# Patient Record
Sex: Male | Born: 1965
Health system: Southern US, Community
[De-identification: ages and names within clinical notes are randomized; demographics above are authoritative.]

## PROBLEM LIST (undated history)

## (undated) DIAGNOSIS — I4891 Unspecified atrial fibrillation: Secondary | ICD-10-CM

## (undated) DIAGNOSIS — T7840XA Allergy, unspecified, initial encounter: Secondary | ICD-10-CM

## (undated) DIAGNOSIS — I499 Cardiac arrhythmia, unspecified: Secondary | ICD-10-CM

## (undated) DIAGNOSIS — G4730 Sleep apnea, unspecified: Secondary | ICD-10-CM

## (undated) DIAGNOSIS — I10 Essential (primary) hypertension: Secondary | ICD-10-CM

## (undated) DIAGNOSIS — M1990 Unspecified osteoarthritis, unspecified site: Secondary | ICD-10-CM

## (undated) DIAGNOSIS — G709 Myoneural disorder, unspecified: Secondary | ICD-10-CM

## (undated) HISTORY — DX: Unspecified osteoarthritis, unspecified site: M19.90

## (undated) HISTORY — DX: Unspecified atrial fibrillation: I48.91

## (undated) HISTORY — DX: Cardiac arrhythmia, unspecified: I49.9

## (undated) HISTORY — DX: Essential (primary) hypertension: I10

## (undated) HISTORY — DX: Myoneural disorder, unspecified: G70.9

## (undated) HISTORY — DX: Sleep apnea, unspecified: G47.30

## (undated) HISTORY — PX: CARDIOVERSION: SHX1299

## (undated) HISTORY — PX: INGUINAL HERNIA REPAIR: SUR1180

## (undated) HISTORY — DX: Allergy, unspecified, initial encounter: T78.40XA

---

## 1995-05-10 HISTORY — PX: APPENDECTOMY: SHX54

## 1998-08-20 ENCOUNTER — Observation Stay (HOSPITAL_COMMUNITY): Admission: EM | Admit: 1998-08-20 | Discharge: 1998-08-21 | Payer: Self-pay | Admitting: Emergency Medicine

## 1998-08-20 ENCOUNTER — Encounter: Payer: Self-pay | Admitting: Cardiology

## 2000-07-19 ENCOUNTER — Emergency Department (HOSPITAL_COMMUNITY): Admission: EM | Admit: 2000-07-19 | Discharge: 2000-07-19 | Payer: Self-pay | Admitting: Emergency Medicine

## 2000-07-19 ENCOUNTER — Encounter: Payer: Self-pay | Admitting: Emergency Medicine

## 2000-11-24 ENCOUNTER — Ambulatory Visit (HOSPITAL_COMMUNITY): Admission: RE | Admit: 2000-11-24 | Discharge: 2000-11-24 | Payer: Self-pay | Admitting: Cardiology

## 2008-08-12 ENCOUNTER — Other Ambulatory Visit: Admission: RE | Admit: 2008-08-12 | Discharge: 2008-08-12 | Payer: Self-pay | Admitting: Otolaryngology

## 2010-09-24 NOTE — Procedures (Signed)
[facility]  Patient:    Dominic Davies, Dominic Davies                         MRN: [id number] Proc. Date: 11/24/00 Adm. Date:  [id number] Disc. Date: [id number] Attending:  Rudean Hitt                           Procedure Report  PROCEDURE:  Elective cardioversion.  HISTORY:  This is a 45 year old gentleman with recent onset of atrial flutter.  He has been on Coumadin.  He has failed to convert on an outpatient regimen of beta blocker.  DESCRIPTION OF PROCEDURE:  After suitable anesthesia given intravenously, the patient was given a single 50 joule shock using biphasic defibrillator.  He promptly converted to sinus rhythm.  There were no arrhythmias.  There were no postanesthetic complications.  The patient will continue on Toprol XL 50 mg one daily, and he will continue on his Coumadin.  He will be rechecked in two weeks for office visit protime and EKG. DD:  11/24/00 TD:  11/25/00 Job: [id number]

## 2011-09-07 HISTORY — PX: INGUINAL HERNIA REPAIR: SUR1180

## 2014-05-04 NOTE — Progress Notes (Signed)
Patient ID: Dominic Davies, male   DOB: 07/05/1965, 48 y.o.   MRN: [id number]    48 yo referred for palpitations Saw Dr Mare Ferrari over 10 years ago for same. Had PAF  One episode broke in ER with cardizem  One year was on coumadin for a month and had [facility] Has been fine for over 10 years.  Some stress and axniety.  November started having intermittent palpitations. No real long episodes.  Rapid and irregular  Sometimes with exertion on elliptical. No presyncope  Occasional dypsnea  Occassional chest pain but radiates to shoulder which he has injured with capsulitis and restricted ROM.  No rest pain Last month palpitations less frequent only about 2 times lasting minutes.     ROS: Denies fever, malais, weight loss, blurry vision, decreased visual acuity, cough, sputum, SOB, hemoptysis, pleuritic pain, palpitaitons, heartburn, abdominal pain, melena, lower extremity edema, claudication, or rash.  All other systems reviewed and negative   General: Affect appropriate Healthy:  appears stated age 48: normal Neck supple with no adenopathy JVP normal no bruits no thyromegaly Lungs clear with no wheezing and good diaphragmatic motion Heart:  S1/S2 no murmur,rub, gallop or click PMI normal Abdomen: benighn, BS positve, no tenderness, no AAA no bruit.  No HSM or HJR Distal pulses intact with no bruits No edema Neuro non-focal Skin warm and dry No muscular weakness  Medications No current outpatient prescriptions on file.   No current facility-administered medications for this visit.    Allergies Review of patient's allergies indicates not on file.  Family History: No family history on file.  Social History: History   Social History  . Marital Status: Single    Spouse Name: N/A    Number of Children: N/A  . Years of Education: N/A   Occupational History  . Not on file.   Social History Main Topics  . Smoking status: Not on file  . Smokeless tobacco: Not on file  . Alcohol  Use: Not on file  . Drug Use: Not on file  . Sexual Activity: Not on file   Other Topics Concern  . Not on file   Social History Narrative  . No narrative on file    No past surgical history on file.  No past medical history on file.  Electrocardiogram:  SR rate 72  Normal 05/06/14    Assessment and Plan

## 2014-05-06 ENCOUNTER — Encounter: Payer: Self-pay | Admitting: Cardiovascular Disease

## 2014-05-06 ENCOUNTER — Ambulatory Visit (INDEPENDENT_AMBULATORY_CARE_PROVIDER_SITE_OTHER): Payer: 59 | Admitting: Cardiovascular Disease

## 2014-05-06 VITALS — BP 133/78 | HR 72 | Ht 75.0 in | Wt 187.0 lb

## 2014-05-06 DIAGNOSIS — R079 Chest pain, unspecified: Secondary | ICD-10-CM | POA: Insufficient documentation

## 2014-05-06 DIAGNOSIS — R002 Palpitations: Secondary | ICD-10-CM | POA: Insufficient documentation

## 2014-05-06 LAB — CBC WITH DIFFERENTIAL/PLATELET
Basophils Absolute: 0 10*3/uL (ref 0.0–0.1)
Basophils Relative: 0.6 % (ref 0.0–3.0)
Eosinophils Absolute: 0.4 10*3/uL (ref 0.0–0.7)
Eosinophils Relative: 5 % (ref 0.0–5.0)
HCT: 45.6 % (ref 39.0–52.0)
Hemoglobin: 15.4 g/dL (ref 13.0–17.0)
Lymphocytes Relative: 32.2 % (ref 12.0–46.0)
Lymphs Abs: 2.5 10*3/uL (ref 0.7–4.0)
MCHC: 33.7 g/dL (ref 30.0–36.0)
MCV: 89.4 fl (ref 78.0–100.0)
Monocytes Absolute: 0.6 10*3/uL (ref 0.1–1.0)
Monocytes Relative: 8.3 % (ref 3.0–12.0)
Neutro Abs: 4.2 10*3/uL (ref 1.4–7.7)
Neutrophils Relative %: 53.9 % (ref 43.0–77.0)
Platelets: 281 10*3/uL (ref 150.0–400.0)
RBC: 5.1 Mil/uL (ref 4.22–5.81)
RDW: 13 % (ref 11.5–15.5)
WBC: 7.7 10*3/uL (ref 4.0–10.5)

## 2014-05-06 LAB — BASIC METABOLIC PANEL
BUN: 13 mg/dL (ref 6–23)
CO2: 28 mEq/L (ref 19–32)
Calcium: 9.2 mg/dL (ref 8.4–10.5)
Chloride: 104 mEq/L (ref 96–112)
Creatinine, Ser: 0.9 mg/dL (ref 0.4–1.5)
GFR: 94.09 mL/min (ref >60.00)
Glucose, Bld: 97 mg/dL (ref 70–99)
Potassium: 4.1 mEq/L (ref 3.5–5.1)
Sodium: 140 mEq/L (ref 135–145)

## 2014-05-06 LAB — T4, FREE: Free T4: 0.85 ng/dL (ref 0.60–1.60)

## 2014-05-06 LAB — TSH: TSH: 1.1 u[IU]/mL (ref 0.35–4.50)

## 2014-05-06 MED ORDER — PROPRANOLOL HCL 10 MG PO TABS: 10.0000 mg | ORAL_TABLET | Freq: Every day | ORAL | Status: DC | PRN

## 2014-05-06 NOTE — Patient Instructions (Signed)
Your physician recommends that you schedule a follow-up appointment in:  [place] has recommended you make the following change in your medication:  MAY  TAKE  PROPRANOLOL  10 MG  DAILY AS NEEDED Your physician has requested that you have a stress echocardiogram. For further information please visit [email or web address]. Please follow instruction sheet as given. Your physician recommends that you return for lab work in:  TODAY   BMET  CBC TSH  FREE T4

## 2014-05-06 NOTE — Assessment & Plan Note (Signed)
CHADVASD 0  Not frequent enough for monitoring  PRN Inderal assess atrial size with stress  Echo. If no evidence of CAD can consider pill in pocket flecainide for prolonged episodes.  ASA

## 2014-05-06 NOTE — Assessment & Plan Note (Signed)
Atypical baseline ECG normal with history of PAF will order stress echo

## 2014-05-13 ENCOUNTER — Encounter: Payer: Self-pay | Admitting: *Deleted

## 2014-05-14 ENCOUNTER — Encounter: Payer: Self-pay | Admitting: Cardiology

## 2014-05-14 ENCOUNTER — Ambulatory Visit (HOSPITAL_COMMUNITY): Payer: 59 | Attending: Cardiovascular Disease

## 2014-05-14 DIAGNOSIS — R079 Chest pain, unspecified: Secondary | ICD-10-CM | POA: Diagnosis present

## 2014-05-14 NOTE — Progress Notes (Signed)
Echo performed. 

## 2014-06-05 ENCOUNTER — Encounter: Payer: Self-pay | Admitting: *Deleted

## 2014-07-27 NOTE — Progress Notes (Signed)
Patient ID: Dominic Davies, male   DOB: Feb 09, 1966, 49 y.o.   MRN: [id number]    49 y.o.  referred for palpitations Saw Dr Mare Ferrari over 10 years ago for same. Had PAF  One episode broke in ER with cardizem  One year was on coumadin for a month and had [facility] Has been fine for over 10 years.  Some stress and axniety.  November started having intermittent palpitations. No real long episodes.  Rapid and irregular  Sometimes with exertion on elliptical. No presyncope  Occasional dypsnea  Occassional chest pain but radiates to shoulder which he has injured with capsulitis and restricted ROM.  No rest pain Last month palpitations less frequent only about 2 times lasting minutes.    F/U stress echo reviewed 05/14/14  Normal with EF 60% and no valve disease   ROS: Denies fever, malais, weight loss, blurry vision, decreased visual acuity, cough, sputum, SOB, hemoptysis, pleuritic pain, palpitaitons, heartburn, abdominal pain, melena, lower extremity edema, claudication, or rash.  All other systems reviewed and negative   General: Affect appropriate Healthy:  appears stated age 49: normal Neck supple with no adenopathy JVP normal no bruits no thyromegaly Lungs clear with no wheezing and good diaphragmatic motion Heart:  S1/S2 no murmur,rub, gallop or click PMI normal Abdomen: benighn, BS positve, no tenderness, no AAA no bruit.  No HSM or HJR Distal pulses intact with no bruits No edema Neuro non-focal Skin warm and dry No muscular weakness  Medications Current Outpatient Prescriptions  Medication Sig Dispense Refill  . propranolol (INDERAL) 10 MG tablet Take 1 tablet (10 mg total) by mouth daily as needed. 30 tablet 3   No current facility-administered medications for this visit.    Allergies Review of patient's allergies indicates no known allergies.  Family History: Family History  Problem Relation Age of Onset  . Heart attack    . Heart disease    . Stroke    . Hypertension      . CAD    . Parkinson's disease Father     Social History: History   Social History  . Marital Status: Single    Spouse Name: N/A  . Number of Children: N/A  . Years of Education: N/A   Occupational History  . Not on file.   Social History Main Topics  . Smoking status: Never Smoker   . Smokeless tobacco: Never Used  . Alcohol Use: No  . Drug Use: No  . Sexual Activity: Not on file   Other Topics Concern  . Not on file   Social History Narrative  . No narrative on file    Past Surgical History  Procedure Laterality Date  . Appendectomy  1997  . Inguinal hernia repair Left 09/2011  . Cardioversion      Past Medical History  Diagnosis Date  . Irregular heart beat     Electrocardiogram:  SR rate 72  Normal 05/06/14    Assessment and Plan

## 2014-07-29 ENCOUNTER — Encounter: Payer: 59 | Admitting: Cardiovascular Disease

## 2014-12-31 ENCOUNTER — Other Ambulatory Visit: Payer: Self-pay

## 2014-12-31 DIAGNOSIS — R002 Palpitations: Secondary | ICD-10-CM

## 2014-12-31 MED ORDER — PROPRANOLOL HCL 10 MG PO TABS: 10.0000 mg | ORAL_TABLET | Freq: Every day | ORAL | Status: DC | PRN

## 2018-04-13 DIAGNOSIS — Z8679 Personal history of other diseases of the circulatory system: Secondary | ICD-10-CM | POA: Insufficient documentation

## 2018-04-13 DIAGNOSIS — I10 Essential (primary) hypertension: Secondary | ICD-10-CM | POA: Diagnosis not present

## 2018-04-26 DIAGNOSIS — J22 Unspecified acute lower respiratory infection: Secondary | ICD-10-CM | POA: Diagnosis not present

## 2018-04-26 DIAGNOSIS — R05 Cough: Secondary | ICD-10-CM | POA: Diagnosis not present

## 2018-05-11 DIAGNOSIS — Z8679 Personal history of other diseases of the circulatory system: Secondary | ICD-10-CM | POA: Diagnosis not present

## 2018-05-11 DIAGNOSIS — I10 Essential (primary) hypertension: Secondary | ICD-10-CM | POA: Insufficient documentation

## 2018-05-11 DIAGNOSIS — Z23 Encounter for immunization: Secondary | ICD-10-CM | POA: Diagnosis not present

## 2018-05-11 DIAGNOSIS — Z125 Encounter for screening for malignant neoplasm of prostate: Secondary | ICD-10-CM | POA: Diagnosis not present

## 2018-06-27 NOTE — Progress Notes (Signed)
Cardiology Office Note   Date:  07/05/2018   ID:  ZYMARION FAVORITE, DOB 11/15/1965, MRN [id number]  PCP:  Patient, No Pcp Per  Cardiologist:   Jenkins Rouge, MD   No chief complaint on file.     History of Present Illness: Dominic Davies is a 53 y.o. male who presents for consultation regarding palpitations and elevated BP. Referred by [place] FNP No primary. 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 Irregular heart beat     Past Surgical History:  Procedure Laterality Date  . APPENDECTOMY  1997  . CARDIOVERSION    . INGUINAL HERNIA REPAIR Left 09/2011     Current Outpatient Medications  Medication Sig Dispense Refill  . lisinopril (PRINIVIL,ZESTRIL) 20 MG tablet Take 1 tablet by mouth daily.     No current facility-administered medications for this visit.     Allergies:   Patient has no known allergies.    Social History:  The patient  reports that he has never smoked. He has never used smokeless tobacco. He reports that he does not drink alcohol or use drugs.   Family History:  The patient's family history includes CAD in his unknown relative; Heart attack in his unknown relative; Heart disease in his unknown relative; Hypertension in his unknown relative; Parkinson's disease in his father; Stroke in his unknown relative.    ROS:  Please see the history of present illness.   Otherwise, review of systems are positive for none.   All other systems are reviewed and negative.    PHYSICAL EXAM: VS:  BP 130/86   Pulse (!) 59   Ht 6\' 3"  (1.905 m)   Wt  86.2 kg   SpO2 97%   BMI 23.75 kg/m  , BMI Body mass index is 23.75 kg/m. Affect appropriate Healthy:  appears stated age 30: normal Neck supple with no adenopathy JVP normal no bruits no thyromegaly Lungs clear with no wheezing and good diaphragmatic motion Heart:  S1/S2 no murmur, no rub, gallop or click PMI normal Abdomen: benighn, BS positve, no tenderness, no AAA no bruit.  No HSM or HJR Distal pulses intact with no bruits No edema Neuro non-focal Skin warm and dry No muscular weakness    EKG:  07/05/18 NSR normal rate 59 LAE    Recent Labs: No results found for requested labs within last 8760 hours.    Lipid Panel No results found for: CHOL, TRIG, HDL, CHOLHDL, VLDL, LDLCALC, LDLDIRECT    Wt Readings from Last 3 Encounters:  07/05/18 86.2 kg  05/06/14 84.8 kg      Other studies Reviewed: Additional studies/ records that were reviewed today include: notes from cardiology 2015 stress echo ECG .    ASSESSMENT AND PLAN:  1.  Palpitations:/ PAF history 30 day event monitor  2.  BP:  Improved with ACE continue     Current medicines are reviewed at length with the patient today.  The patient does not have concerns regarding medicines.  The following changes have been made:  None  Labs/ tests ordered today include: Event monitor   Orders Placed This Encounter  Procedures  . CARDIAC EVENT MONITOR  . EKG 12-Lead     Disposition:   FU with cardiology in 4-6 weeks      Signed, Jenkins Rouge, MD  07/05/2018 9:23 AM    [address], [place], [place]  [address] Phone: [phone]; Fax: [phone]

## 2018-07-05 ENCOUNTER — Encounter: Payer: Self-pay | Admitting: Cardiovascular Disease

## 2018-07-05 ENCOUNTER — Encounter (INDEPENDENT_AMBULATORY_CARE_PROVIDER_SITE_OTHER): Payer: Self-pay

## 2018-07-05 ENCOUNTER — Ambulatory Visit: Payer: BLUE CROSS/BLUE SHIELD | Admitting: Cardiovascular Disease

## 2018-07-05 VITALS — BP 130/86 | HR 59 | Ht 75.0 in | Wt 190.0 lb

## 2018-07-05 DIAGNOSIS — I10 Essential (primary) hypertension: Secondary | ICD-10-CM

## 2018-07-05 DIAGNOSIS — R002 Palpitations: Secondary | ICD-10-CM

## 2018-07-05 DIAGNOSIS — Z8679 Personal history of other diseases of the circulatory system: Secondary | ICD-10-CM | POA: Diagnosis not present

## 2018-07-05 NOTE — Patient Instructions (Signed)
Medication Instructions:   If you need a refill on your cardiac medications before your next appointment, please call your pharmacy.   Lab work:  If you have labs (blood work) drawn today and your tests are completely normal, you will receive your results only by: Marland Kitchen MyChart Message (if you have MyChart) OR . A paper copy in the mail If you have any lab test that is abnormal or we need to change your treatment, we will call you to review the results.  Testing/Procedures:  Your physician has recommended that you wear an event monitor. Event monitors are medical devices that record the heart's electrical activity. Doctors most often [place] these monitors to diagnose arrhythmias. Arrhythmias are problems with the speed or rhythm of the heartbeat. The monitor is a small, portable device. You can wear one while you do your normal daily activities. This is usually used to diagnose what is causing palpitations/syncope (passing out).  Follow-Up: At [facility], you and your health needs are our priority.  As part of our continuing mission to provide you with exceptional heart care, we have created designated Provider Care Teams.  These Care Teams include your primary Cardiologist (physician) and Advanced Practice Providers (APPs -  Physician Assistants and Nurse Practitioners) who all work together to provide you with the care you need, when you need it. You will need a follow up appointment in 6 weeks. You may see Dr. Johnsie Cancel or one of the following Advanced Practice Providers on your designated Care Team:   Truitt Merle, NP Cecilie Kicks, NP . Kathyrn Drown, NP

## 2018-07-11 ENCOUNTER — Ambulatory Visit (INDEPENDENT_AMBULATORY_CARE_PROVIDER_SITE_OTHER): Payer: BLUE CROSS/BLUE SHIELD

## 2018-07-11 DIAGNOSIS — R002 Palpitations: Secondary | ICD-10-CM | POA: Diagnosis not present

## 2018-07-11 DIAGNOSIS — Z8679 Personal history of other diseases of the circulatory system: Secondary | ICD-10-CM | POA: Diagnosis not present

## 2018-07-11 DIAGNOSIS — I10 Essential (primary) hypertension: Secondary | ICD-10-CM | POA: Diagnosis not present

## 2018-08-03 ENCOUNTER — Encounter: Payer: Self-pay | Admitting: Cardiovascular Disease

## 2018-08-07 ENCOUNTER — Telehealth: Payer: Self-pay

## 2018-08-07 NOTE — Telephone Encounter (Signed)
Patient is going to sign up for MyChart, so will send instructions to patient through MyChart once he is signed up.      Virtual Visit Pre-Appointment Phone Call  Steps For Call:  1. Confirm consent - "In the setting of the current Covid19 crisis, you are scheduled for a (phone or video) visit with your provider on (date) at (time).  Just as we do with many in-office visits, in order for you to participate in this visit, we must obtain consent.  If you'd like, I can send this to your mychart (if signed up) or email for you to review.  Otherwise, I can obtain your verbal consent now.  All virtual visits are billed to your insurance company just like a normal visit would be.  By agreeing to a virtual visit, we'd like you to understand that the technology does not allow for your provider to perform an examination, and thus may limit your provider's ability to fully assess your condition.  Finally, though the technology is pretty good, we cannot assure that it will always work on either your or our end, and in the setting of a video visit, we may have to convert it to a phone-only visit.  In either situation, we cannot ensure that we have a secure connection.  Are you willing to proceed?"  2. Give patient instructions for WebEx download to smartphone as below if video visit  3. Advise patient to be prepared with any vital sign or heart rhythm information, their current medicines, and a piece of paper and pen handy for any instructions they may receive the day of their visit  4. Inform patient they will receive a phone call 15 minutes prior to their appointment time (may be from unknown caller ID) so they should be prepared to answer  5. Confirm that appointment type is correct in Epic appointment notes (video vs telephone)    TELEPHONE CALL NOTE  Dominic Davies has been deemed a candidate for a follow-up tele-health visit to limit community exposure during the Covid-19 pandemic. I spoke with the  patient via phone to ensure availability of phone/video source, confirm preferred email & phone number, and discuss instructions and expectations.  I reminded Dominic Davies to be prepared with any vital sign and/or heart rhythm information that could potentially be obtained via home monitoring, at the time of his visit. I reminded Dominic Davies to expect a phone call at the time of his visit if his visit.  Did the patient verbally acknowledge consent to treatment? yes  Michaelyn Barter, RN 08/07/2018 11:10 AM   DOWNLOADING THE [place] TO SMARTPHONE  - If Apple, go to [employer] and type in WebEx in the search bar. [place] [employer], the blue/green circle. The app is free but as with any other app downloads, their phone may require them to verify saved payment information or Apple password. The patient does NOT have to create an account.  - If Android, ask patient to go to [employer] and type in WebEx in the search bar. [place] [employer], the blue/green circle. The app is free but as with any other app downloads, their phone may require them to verify saved payment information or Android password. The patient does NOT have to create an account.   CONSENT FOR TELE-HEALTH VISIT - PLEASE REVIEW  I hereby voluntarily request, consent and authorize [facility] and its employed or contracted physicians, Engineer, materials, nurse practitioners or  other licensed health care professionals (the Practitioner), to provide me with telemedicine health care services (the "Services") as deemed necessary by the treating Practitioner. I acknowledge and consent to receive the Services by the Practitioner via telemedicine. I understand that the telemedicine visit will involve communicating with the Practitioner through live audiovisual communication technology and the disclosure of certain medical information by electronic transmission. I acknowledge that I have been given  the opportunity to request an in-person assessment or other available alternative prior to the telemedicine visit and am voluntarily participating in the telemedicine visit.  I understand that I have the right to withhold or withdraw my consent to the use of telemedicine in the course of my care at any time, without affecting my right to future care or treatment, and that the Practitioner or I may terminate the telemedicine visit at any time. I understand that I have the right to inspect all information obtained and/or recorded in the course of the telemedicine visit and may receive copies of available information for a reasonable fee.  I understand that some of the potential risks of receiving the Services via telemedicine include:  Marland Kitchen Delay or interruption in medical evaluation due to technological equipment failure or disruption; . Information transmitted may not be sufficient (e.g. poor resolution of images) to allow for appropriate medical decision making by the Practitioner; and/or  . In rare instances, security protocols could fail, causing a breach of personal health information.  Furthermore, I acknowledge that it is my responsibility to provide information about my medical history, conditions and care that is complete and accurate to the best of my ability. I acknowledge that Practitioner's advice, recommendations, and/or decision may be based on factors not within their control, such as incomplete or inaccurate data provided by me or distortions of diagnostic images or specimens that may result from electronic transmissions. I understand that the practice of medicine is not an exact science and that Practitioner makes no warranties or guarantees regarding treatment outcomes. I acknowledge that I will receive a copy of this consent concurrently upon execution via email to the email address I last provided but may also request a printed copy by calling the office of [place].    I understand  that my insurance will be billed for this visit.   I have read or had this consent read to me. . I understand the contents of this consent, which adequately explains the benefits and risks of the Services being provided via telemedicine.  . I have been provided ample opportunity to ask questions regarding this consent and the Services and have had my questions answered to my satisfaction. . I give my informed consent for the services to be provided through the use of telemedicine in my medical care  By participating in this telemedicine visit I agree to the above.

## 2018-08-10 NOTE — Telephone Encounter (Signed)
Follow up      Webex trial run for 08-14-18 ov was successful.

## 2018-08-10 NOTE — Progress Notes (Signed)
Virtual Visit via Video Note    Evaluation Performed:  Follow-up visit  This visit type was conducted due to national recommendations for restrictions regarding the COVID-19 Pandemic (e.g. social distancing).  This format is felt to be most appropriate for this patient at this time.  All issues noted in this document were discussed and addressed.  No physical exam was performed (except for noted visual exam findings with Video Visits).  Please refer to the patient's chart (MyChart message for video visits and phone note for telephone visits) for the patient's consent to telehealth for [facility].  Date:  08/14/2018   ID:  Dominic Davies, DOB 06/21/1965, MRN [id number]  Patient Location:  Home  Provider location:   Office  PCP:  Patient, No Pcp Per  Cardiologist:  Johnsie Cancel Electrophysiologist:  None   Chief Complaint:  Palpitations   History of Present Illness:    Dominic Davies is a 53 y.o. male who presents via audio/video conferencing for a telehealth visit today.    First seen 07/05/18 for HTN and palpitations Had PAF in 2015 quiescent for years. 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    Prior CV studies:   The following studies were reviewed today:  Event monitor NSR PVC;s no significant arrhythmia  Past Medical History:  Diagnosis Date  . Irregular heart beat    Past Surgical History:  Procedure Laterality Date  . APPENDECTOMY  1997  . CARDIOVERSION    . INGUINAL HERNIA REPAIR Left 09/2011     Current Meds  Medication Sig  . lisinopril (PRINIVIL,ZESTRIL) 20 MG tablet Take 1 tablet  by mouth daily.     Allergies:   Patient has no known allergies.   Social History   Tobacco Use  . Smoking status: Never Smoker  . Smokeless tobacco: Never Used  Substance Use Topics  . Alcohol use: No    Alcohol/week: 0.0 standard drinks  . Drug use: No     Family Hx: The patient's family history includes CAD in an other family member; Heart attack in an other family member; Heart disease in an other family member; Hypertension in an other family member; Parkinson's disease in his father; Stroke in an other family member.  ROS:   Please see the history of present illness.     All other systems reviewed and are negative.   Labs/Other Tests and Data Reviewed:    Recent Labs: No results found for requested labs within last 8760 hours.   Recent Lipid Panel No results found for: CHOL, TRIG, HDL, CHOLHDL, LDLCALC, LDLDIRECT  Wt Readings from Last 3 Encounters:  08/14/18 84.4 kg  07/05/18 86.2 kg  05/06/14 84.8 kg     Objective:    Vital Signs:  BP (!) 153/87   Pulse 69   Ht 6\' 1"  (1.854 m)   Wt 84.4 kg   BMI 24.54 kg/m    Well nourished, well developed male in no acute distress. No tachypnea Wearing glasses No JVP Skin warm and dry  No edema  ASSESSMENT & PLAN:    1. HTN:  Improved on ACE see  below regarding adding lopressor 2. Palpitations: Distant history of PAF Monitor with PVC;s add lopressor 25 bid and check echo to r/o DCM or structural heart disease Due to COVID restrictions echo may be delayed 8 weeks  COVID-19 Education: The signs and symptoms of COVID-19 were discussed with the patient and how to seek care for testing (follow up with PCP or arrange E-visit).  The importance of social distancing was discussed today.  Patient Risk:   After full review of this patient's clinical status, I feel that they are at least moderate risk at this time.  Time:   Today, I have spent 30 minutes with the patient with telehealth technology discussing  palpitations, PAF and HTN .     Medication Adjustments/Labs and Tests Ordered: Current medicines are reviewed at length with the patient today.  Concerns regarding medicines are outlined above.  Tests Ordered: No orders of the defined types were placed in this encounter.  Medication Changes: No orders of the defined types were placed in this encounter.   Disposition:  Follow up in a year  Signed, Jenkins Rouge, MD  08/14/2018 8:33 AM    [place]

## 2018-08-14 ENCOUNTER — Other Ambulatory Visit: Payer: Self-pay

## 2018-08-14 ENCOUNTER — Telehealth (INDEPENDENT_AMBULATORY_CARE_PROVIDER_SITE_OTHER): Payer: Self-pay | Admitting: Cardiovascular Disease

## 2018-08-14 ENCOUNTER — Encounter: Payer: Self-pay | Admitting: Cardiovascular Disease

## 2018-08-14 VITALS — BP 153/87 | HR 69 | Ht 73.0 in | Wt 186.0 lb

## 2018-08-14 DIAGNOSIS — R002 Palpitations: Secondary | ICD-10-CM | POA: Diagnosis not present

## 2018-08-14 MED ORDER — METOPROLOL TARTRATE 25 MG PO TABS: 25.0000 mg | ORAL_TABLET | Freq: Two times a day (BID) | ORAL | 3 refills | Status: DC

## 2018-08-14 NOTE — Patient Instructions (Addendum)
Medication Instructions:  Your physician has recommended you make the following change in your medication:  1-Metoprolol 25 mg by mouth twice daily.  Please take your lisinopril at lunch time  If you need a refill on your cardiac medications before your next appointment, please call your pharmacy.   Lab work:  If you have labs (blood work) drawn today and your tests are completely normal, you will receive your results only by: Marland Kitchen MyChart Message (if you have MyChart) OR . A paper copy in the mail If you have any lab test that is abnormal or we need to change your treatment, we will call you to review the results.  Testing/Procedures: Your physician has requested that you have an echocardiogram in 2 months. Echocardiography is a painless test that uses sound waves to create images of your heart. It provides your doctor with information about the size and shape of your heart and how well your heart's chambers and valves are working. This procedure takes approximately one hour. There are no restrictions for this procedure.  Follow-Up: At [facility], you and your health needs are our priority.  As part of our continuing mission to provide you with exceptional heart care, we have created designated Provider Care Teams.  These Care Teams include your primary Cardiologist (physician) and Advanced Practice Providers (APPs -  Physician Assistants and Nurse Practitioners) who all work together to provide you with the care you need, when you need it. You will need a follow up appointment in 6 to 8 weeks for virtual visit.  You may see Dr. Johnsie Cancel or one of the following Advanced Practice Providers on your designated Care Team:   Truitt Merle, NP Cecilie Kicks, NP . Kathyrn Drown, NP

## 2018-09-20 NOTE — Progress Notes (Signed)
Virtual Visit via Video Note    Evaluation Performed:  Follow-up visit  This visit type was conducted due to national recommendations for restrictions regarding the COVID-19 Pandemic (e.g. social distancing).  This format is felt to be most appropriate for this patient at this time.  All issues noted in this document were discussed and addressed.  No physical exam was performed (except for noted visual exam findings with Video Visits).  Please refer to the patient's chart (MyChart message for video visits and phone note for telephone visits) for the patient's consent to telehealth for [facility].  Date:  09/24/2018   ID:  Dominic Davies, DOB 05/22/1965, MRN [id number]  Patient Location:  Home  Provider location:   Office  PCP:  Patient, No Pcp Per  Cardiologist:  Johnsie Cancel Electrophysiologist:  None   Chief Complaint:  Palpitations   History of Present Illness:    Dominic Davies is a 53 y.o. male who presents via audio/video conferencing for a telehealth visit today.    First seen 07/05/18 for HTN and palpitations Had PAF in 2015 quiescent for years. CHADVASC 1 normal echo 2016 Rx with PRN inderal   He does software sales related to [employer] Palpitations seem more like PVC;s not PAF Intermitant And not sustained fast  No ETOH Limited caffeine Started on ACE for BP in November by [facility]  07/11/18 Event monitor reviewed NSR no arrhythmia No PAF   Started on beta blocker 08/14/18  Feels better with it Some fatige   The patient does not have symptoms concerning for COVID-19 infection (fever, chills, cough, or new shortness of breath).    Prior CV studies:   The following studies were reviewed today:  07/11/18 Event monitor NSR PVC;s no significant arrhythmia  Past Medical History:  Diagnosis Date  . Irregular heart beat    Past Surgical History:  Procedure Laterality Date  . APPENDECTOMY  1997  . CARDIOVERSION    . INGUINAL HERNIA REPAIR Left 09/2011      Current Meds  Medication Sig  . lisinopril (PRINIVIL,ZESTRIL) 20 MG tablet Take 1 tablet by mouth daily.  . metoprolol tartrate (LOPRESSOR) 25 MG tablet Take 1 tablet (25 mg total) by mouth 2 (two) times daily.     Allergies:   Patient has no known allergies.   Social History   Tobacco Use  . Smoking status: Never Smoker  . Smokeless tobacco: Never Used  Substance Use Topics  . Alcohol use: No    Alcohol/week: 0.0 standard drinks  . Drug use: No     Family Hx: The patient's family history includes CAD in an other family member; Heart attack in an other family member; Heart disease in an other family member; Hypertension in an other family member; Parkinson's disease in his father; Stroke in an other family member.  ROS:   Please see the history of present illness.     All other systems reviewed and are negative.   Labs/Other Tests and Data Reviewed:    Recent Labs: No results found for requested labs within last 8760 hours.   Recent Lipid Panel No results found for: CHOL, TRIG, HDL, CHOLHDL, LDLCALC, LDLDIRECT  Wt Readings from Last 3 Encounters:  09/24/18 85.3 kg  08/14/18 84.4 kg  07/05/18 86.2 kg     Objective:    Vital Signs:  BP (!) 141/77   Pulse (!) 57   Ht 6' (1.829 m)   Wt 85.3 kg   BMI 25.50 kg/m  Well nourished, well developed male in no acute distress. No tachypnea Wearing glasses No JVP Skin warm and dry  No edema  ASSESSMENT & PLAN:    1. HTN:  Improved on ACE see below regarding adding lopressor 2. Palpitations: Distant history of PAF Monitor with PVC;s Lopressor added Improved  check echo to r/o DCM or structural heart disease Due to COVID restrictions echo may be delayed 8 weeks  COVID-19 Education: The signs and symptoms of COVID-19 were discussed with the patient and how to seek care for testing (follow up with PCP or arrange E-visit).  The importance of social distancing was discussed today.  Patient Risk:   After full  review of this patient's clinical status, I feel that they are at least moderate risk at this time.  Time:   Today, I have spent 30 minutes with the patient with telehealth technology discussing palpitations, PAF and HTN .     Medication Adjustments/Labs and Tests Ordered: Current medicines are reviewed at length with the patient today.  Concerns regarding medicines are outlined above.  Tests Ordered:  Echo for palpitations  Medication Changes: No orders of the defined types were placed in this encounter.   Disposition: F/U in 6 months if echo ok   Signed, Jenkins Rouge, MD  09/24/2018 8:53 AM    [place]

## 2018-09-24 ENCOUNTER — Other Ambulatory Visit: Payer: Self-pay

## 2018-09-24 ENCOUNTER — Telehealth (INDEPENDENT_AMBULATORY_CARE_PROVIDER_SITE_OTHER): Payer: BLUE CROSS/BLUE SHIELD | Admitting: Cardiovascular Disease

## 2018-09-24 ENCOUNTER — Encounter: Payer: Self-pay | Admitting: Cardiovascular Disease

## 2018-09-24 VITALS — BP 141/77 | HR 57 | Ht 72.0 in | Wt 188.0 lb

## 2018-09-24 DIAGNOSIS — R002 Palpitations: Secondary | ICD-10-CM | POA: Diagnosis not present

## 2018-09-24 NOTE — Patient Instructions (Addendum)
Medication Instructions:   If you need a refill on your cardiac medications before your next appointment, please call your pharmacy.   Lab work:  If you have labs (blood work) drawn today and your tests are completely normal, you will receive your results only by: Marland Kitchen MyChart Message (if you have MyChart) OR . A paper copy in the mail If you have any lab test that is abnormal or we need to change your treatment, we will call you to review the results.  Testing/Procedures: Please keep your scheduled echocardiogram appointment on 10/16/18.  Follow-Up: At [facility], you and your health needs are our priority.  As part of our continuing mission to provide you with exceptional heart care, we have created designated Provider Care Teams.  These Care Teams include your primary Cardiologist (physician) and Advanced Practice Providers (APPs -  Physician Assistants and Nurse Practitioners) who all work together to provide you with the care you need, when you need it. You will need a follow up appointment in 3 months. You may see Dr. Johnsie Cancel or one of the following Advanced Practice Providers on your designated Care Team:   Truitt Merle, NP Cecilie Kicks, NP . Kathyrn Drown, NP  Someone will call you to make an appointment for your follow-up visit.

## 2018-10-15 ENCOUNTER — Telehealth (HOSPITAL_COMMUNITY): Payer: Self-pay | Admitting: *Deleted

## 2018-10-15 NOTE — Telephone Encounter (Signed)
COVID-19 Pre-Screening Questions:  . Do you currently have a fever? No (yes = cancel and refer to pcp for e-visit) . Have you recently travelled on a cruise, internationally, or to [place], [place], [place], [place], [place], or [place], [place] ([place]) ? No (yes = cancel, stay home, monitor symptoms, and contact pcp or initiate e-visit if symptoms develop) . Have you been in contact with someone that is currently pending confirmation of Covid19 testing or has been confirmed to have the Covid19 virus?  No (yes = cancel, stay home, away from tested individual, monitor symptoms, and contact pcp or initiate e-visit if symptoms develop) . Are you currently experiencing fatigue or cough? No (yes = pt should be prepared to have a mask placed at the time of their visit).   . Reiterated no additional visitors. . Arrive no earlier than 15 minutes before appointment time. . Please bring own mask.  Tracyann Duffell 

## 2018-10-16 ENCOUNTER — Ambulatory Visit (HOSPITAL_COMMUNITY): Payer: BC Managed Care – PPO | Attending: Cardiovascular Disease

## 2018-10-16 ENCOUNTER — Other Ambulatory Visit: Payer: Self-pay

## 2018-10-16 DIAGNOSIS — R002 Palpitations: Secondary | ICD-10-CM | POA: Diagnosis not present

## 2018-12-25 NOTE — Progress Notes (Signed)
Virtual Visit via Video Note    Evaluation Performed:  Follow-up visit  This visit type was conducted due to national recommendations for restrictions regarding the COVID-19 Pandemic (e.g. social distancing).  This format is felt to be most appropriate for this patient at this time.  All issues noted in this document were discussed and addressed.  No physical exam was performed (except for noted visual exam findings with Video Visits).  Please refer to the patient's chart (MyChart message for video visits and phone note for telephone visits) for the patient's consent to telehealth for [facility].  Date:  01/07/2019   ID:  Dominic Davies, DOB [birth year], MRN [id number]  Patient Location:  Home  Provider location:   Office  PCP:  Patient, No Pcp Per  Cardiologist:  Johnsie Cancel Electrophysiologist:  None   Chief Complaint:  Palpitations   History of Present Illness:    Dominic Davies is a 53 y.o. male who presents via audio/video conferencing for a telehealth visit today.    First seen 07/05/18 for HTN and palpitations Had PAF in 2015 quiescent for years. CHADVASC 1 normal echo 2016 Rx with PRN inderal   He does software sales related to [employer] Palpitations seem more like PVC;s not PAF Intermitant And not sustained fast  No ETOH Limited caffeine Started on ACE for BP in November 2019 by [facility]  07/11/18 Event monitor reviewed NSR no arrhythmia No PAF   Started on beta blocker 08/14/18  Feels better with it Some fatige   Echo reviewed 10/16/18 EF 60-65% no valve disease  Daughter got married 3 weeks ago.  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    Prior CV studies:   The following studies were reviewed today:  07/11/18 Event monitor NSR PVC;s no significant arrhythmia  Past Medical  History:  Diagnosis Date  . Irregular heart beat    Past Surgical History:  Procedure Laterality Date  . APPENDECTOMY  1997  . CARDIOVERSION    . INGUINAL HERNIA REPAIR Left 09/2011     Current Meds  Medication Sig  . lisinopril (PRINIVIL,ZESTRIL) 20 MG tablet Take 1 tablet by mouth daily.  . metoprolol tartrate (LOPRESSOR) 25 MG tablet Take 1 tablet (25 mg total) by mouth 2 (two) times daily.     Allergies:   Patient has no known allergies.   Social History   Tobacco Use  . Smoking status: Never Smoker  . Smokeless tobacco: Never Used  Substance Use Topics  . Alcohol use: No    Alcohol/week: 0.0 standard drinks  . Drug use: No     Family Hx: The patient's family history includes CAD in an other family member; Heart attack in an other family member; Heart disease in an other family member; Hypertension in an other family member; Parkinson's disease in his father; Stroke in an other family member.  ROS:   Please see the history of present illness.     All other systems reviewed and are negative.   Labs/Other Tests and Data Reviewed:    Recent Labs: No results found for requested labs within last 8760 hours.   Recent Lipid Panel No results found for: CHOL, TRIG, HDL, CHOLHDL, LDLCALC, LDLDIRECT  Wt Readings from Last 3 Encounters:  01/07/19 184 lb (83.5 kg)  09/24/18 188 lb (85.3  kg)  08/14/18 186 lb (84.4 kg)     Objective:    Vital Signs:  BP (!) 151/94   Pulse 63   Ht 6' (1.829 m)   Wt 184 lb (83.5 kg)   BMI 24.95 kg/m    Well nourished, well developed male in no acute distress. No tachypnea Wearing glasses No JVP Skin warm and dry  No edema  ASSESSMENT & PLAN:    1. HTN:  Improved on ACE see below regarding adding lopressor 2. Palpitations: Distant history of PAF Monitor with PVC;s Lopressor added Improved 3. OSA:  Clinically worried about OSA will order sleep study and f/u with Dr Claiborne Billings or [place] given history of PAF   COVID-19  Education: The signs and symptoms of COVID-19 were discussed with the patient and how to seek care for testing (follow up with PCP or arrange E-visit).  The importance of social distancing was discussed today.  Patient Risk:   After full review of this patient's clinical status, I feel that they are at least moderate risk at this time.  Time:   Today, I have spent 30 minutes with the patient with telehealth technology discussing palpitations, PAF and HTN .     Medication Adjustments/Labs and Tests Ordered: Current medicines are reviewed at length with the patient today.  Concerns regarding medicines are outlined above.  Tests Ordered:  Sleep study  Medication Changes: No orders of the defined types were placed in this encounter.   Disposition: F/U in a year with me f/u Claiborne Billings / Radford Pax post sleep study   Signed, Jenkins Rouge, MD  01/07/2019 8:47 AM    [place]

## 2019-01-04 ENCOUNTER — Telehealth: Payer: Self-pay

## 2019-01-04 NOTE — Telephone Encounter (Signed)
I left a message for the patient to return my call about changing to a virtual visit with Dr. Johnsie Cancel on Monday, August 31st.

## 2019-01-07 ENCOUNTER — Other Ambulatory Visit: Payer: Self-pay

## 2019-01-07 ENCOUNTER — Telehealth (INDEPENDENT_AMBULATORY_CARE_PROVIDER_SITE_OTHER): Payer: BC Managed Care – PPO | Admitting: Cardiovascular Disease

## 2019-01-07 ENCOUNTER — Encounter: Payer: Self-pay | Admitting: Cardiovascular Disease

## 2019-01-07 VITALS — BP 151/94 | HR 63 | Ht 72.0 in | Wt 184.0 lb

## 2019-01-07 DIAGNOSIS — G4733 Obstructive sleep apnea (adult) (pediatric): Secondary | ICD-10-CM | POA: Diagnosis not present

## 2019-01-07 DIAGNOSIS — I10 Essential (primary) hypertension: Secondary | ICD-10-CM

## 2019-01-07 MED ORDER — LISINOPRIL 20 MG PO TABS: 20.0000 mg | ORAL_TABLET | Freq: Every day | ORAL | 3 refills | Status: DC

## 2019-01-07 NOTE — Telephone Encounter (Signed)
Pt is agreeable to switching from office visit to virtual with Dr. Johnsie Cancel on 8/31. Meds have been reviewed.

## 2019-01-07 NOTE — Addendum Note (Signed)
Addended by: Emmaline Life on: 01/07/2019 09:11 AM   Modules accepted: Orders

## 2019-01-07 NOTE — Patient Instructions (Signed)
Medication Instructions:  Your physician recommends that you continue on your current medications as directed. Please refer to the Current Medication list given to you today.  If you need a refill on your cardiac medications before your next appointment, please call your pharmacy.    Lab work: None Ordered    Testing/Procedures: Your physician has recommended that you have a sleep study. This test records several body functions during sleep, including: brain activity, eye movement, oxygen and carbon dioxide blood levels, heart rate and rhythm, breathing rate and rhythm, the flow of air through your mouth and nose, snoring, body muscle movements, and chest and belly movement.     Follow-Up: At [facility], you and your health needs are our priority.  As part of our continuing mission to provide you with exceptional heart care, we have created designated Provider Care Teams.  These Care Teams include your primary Cardiologist (physician) and Advanced Practice Providers (APPs -  Physician Assistants and Nurse Practitioners) who all work together to provide you with the care you need, when you need it. You will need a follow up appointment in 1 years.  Please call our office 2 months in advance to schedule this appointment.  You may see Dr. Johnsie Cancel or one of the following Advanced Practice Providers on your designated Care Team:   Truitt Merle, NP Cecilie Kicks, NP . Kathyrn Drown, NP

## 2019-01-09 ENCOUNTER — Other Ambulatory Visit: Payer: Self-pay | Admitting: Cardiovascular Disease

## 2019-01-09 DIAGNOSIS — I10 Essential (primary) hypertension: Secondary | ICD-10-CM

## 2019-01-09 DIAGNOSIS — G4733 Obstructive sleep apnea (adult) (pediatric): Secondary | ICD-10-CM

## 2019-01-10 ENCOUNTER — Telehealth: Payer: Self-pay | Admitting: *Deleted

## 2019-01-10 NOTE — Telephone Encounter (Signed)
-----   Message from Emmaline Life, RN sent at 01/07/2019  9:02 AM EDT ----- Dr. Kyla Balzarine patient has sleep study ordered and will need f/u with either Dr. Radford Pax or Dr. Claiborne Billings.  Thank you, Sharyn Lull

## 2019-01-10 NOTE — Telephone Encounter (Signed)
Staff message sent to [facility] denied in lab sleep study. Approved HST. Dillon Bjork # [id number]. Valid DOS 01/09/19 to 07/08/19.

## 2019-01-15 ENCOUNTER — Telehealth: Payer: Self-pay | Admitting: *Deleted

## 2019-01-15 NOTE — Telephone Encounter (Signed)
-----   Message from Lauralee Evener, [place] sent at 01/10/2019  2:15 PM EDT ----- [employer] denied in lab sleep study. HST approved. Schedule HST. BCBS Auth # [id number]. Valid Dates 01/09/19 to 07/08/19. ----- Message ----- From: Emmaline Life, RN Sent: 01/07/2019   9:02 AM EDT To: [facility] Alert Sleep Studies  Dr. Kyla Balzarine patient has sleep study ordered and will need f/u with either Dr. Radford Pax or Dr. Claiborne Billings.  Thank you, Sharyn Lull

## 2019-01-15 NOTE — Telephone Encounter (Signed)
Patient is aware and agreeable to Home Sleep Study through [facility]. Patient is scheduled for 03/08/19 at 12 pm to pick up home sleep kit and meet with Respiratory therapist at [facility]. Patient is aware that if this appointment date and time does not work for them they should contact Artis Delay directly at [phone] or call Gae Bon at [phone]. Patient is aware that a sleep packet will be sent from [facility] in week. Patient is agreeable to treatment and thankful for call.

## 2019-03-06 MED ORDER — LISINOPRIL 20 MG PO TABS: 20.0000 mg | ORAL_TABLET | Freq: Every day | ORAL | 3 refills | Status: DC

## 2019-03-08 ENCOUNTER — Ambulatory Visit (HOSPITAL_BASED_OUTPATIENT_CLINIC_OR_DEPARTMENT_OTHER): Payer: BC Managed Care – PPO | Attending: Cardiovascular Disease | Admitting: Cardiology

## 2019-03-08 ENCOUNTER — Other Ambulatory Visit: Payer: Self-pay

## 2019-03-08 DIAGNOSIS — G4733 Obstructive sleep apnea (adult) (pediatric): Secondary | ICD-10-CM | POA: Diagnosis not present

## 2019-03-08 DIAGNOSIS — I10 Essential (primary) hypertension: Secondary | ICD-10-CM | POA: Insufficient documentation

## 2019-03-11 ENCOUNTER — Telehealth: Payer: Self-pay | Admitting: *Deleted

## 2019-03-11 DIAGNOSIS — G4733 Obstructive sleep apnea (adult) (pediatric): Secondary | ICD-10-CM

## 2019-03-11 NOTE — Telephone Encounter (Signed)
Informed patient of sleep study results and patient understanding was verbalized. Patient understands his sleep study showed they have sleep apnea and recommend CPAP titration. Please set up titration in the sleep lab.  Pt is aware and agreeable to his results. cpap titration sent to sleep pool.  

## 2019-03-11 NOTE — Telephone Encounter (Signed)
-----   Message from Sueanne Margarita, MD sent at 03/11/2019  2:45 PM EST ----- Please let patient know that they have sleep apnea and recommend CPAP titration. Please set up titration in the sleep lab.

## 2019-03-11 NOTE — Procedures (Signed)
   Patient Name: Dominic Davies, Dominic Davies Date: 03/10/2019 Gender: Male D.O.B: [birth year] Age (years): 52 Referring Provider: Jenkins Rouge Height (inches): 40 Interpreting Physician: Fransico Him MD, ABSM Weight (lbs): 185 RPSGT: Jacolyn Reedy BMI: 25 MRN: [id number] Neck Size: 15.50  CLINICAL INFORMATION Sleep Study Type: HST  Indication for sleep study: OSA  Epworth Sleepiness Score: 13  SLEEP STUDY TECHNIQUE A multi-channel overnight portable sleep study was performed. The channels recorded were: nasal airflow, thoracic respiratory movement, and oxygen saturation with a pulse oximetry. Snoring was also monitored.  MEDICATIONS Patient self administered medications include: N/A.  SLEEP ARCHITECTURE Patient was studied for 533.2 minutes. The sleep efficiency was 100.0 % and the patient was supine for 52%. The arousal index was 0.0 per hour.  RESPIRATORY PARAMETERS The overall AHI was 9.9 per hour, with a central apnea index of 0.0 per hour.  The oxygen nadir was 90% during sleep.  CARDIAC DATA Mean heart rate during sleep was 59.0 bpm.  IMPRESSIONS - Mild obstructive sleep apnea occurred during this study (AHI = 9.9/h). - No significant central sleep apnea occurred during this study (CAI = 0.0/h). - The patient had minimal or no oxygen desaturation during the study (Min O2 = 90%) - Patient snored 12.2% during the sleep.  DIAGNOSIS - Obstructive Sleep Apnea (327.23 [G47.33 ICD-10])  RECOMMENDATIONS - Therapeutic CPAP titration to determine optimal pressure required to alleviate sleep disordered breathing. - Positional therapy avoiding supine position during sleep. - Surgical consultation for Uvulopalatopharyngoplasty (UPPP) may be considered. - Oral appliance may be considered. - Avoid alcohol, sedatives and other CNS depressants that may worsen sleep apnea and disrupt normal sleep architecture. - Sleep hygiene should be reviewed to assess factors that may improve  sleep quality. - Weight management and regular exercise should be initiated or continued.  [Electronically signed] 03/11/2019 02:43 PM  Fransico Him MD, ABSM Diplomate, American Board of Sleep Medicine

## 2019-03-14 ENCOUNTER — Telehealth: Payer: Self-pay | Admitting: *Deleted

## 2019-03-14 NOTE — Telephone Encounter (Signed)
Staff message sent to [facility] denied in lab CPAP titration. Approved APAP. Please notify provider. Order [id number]. Valid dates 03/14/19 to 06/11/19.

## 2019-03-22 ENCOUNTER — Telehealth: Payer: Self-pay | Admitting: *Deleted

## 2019-03-22 NOTE — Telephone Encounter (Signed)
-----   Message from Lauralee Evener, [place] sent at 03/14/2019  2:53 PM EST ----- Regarding: RE: precert [employer]BS Denied CPAP titration. Approved APAP. Order [id number]. Valid dates 03/14/19 to 06/11/19. ----- Message ----- From: Freada Bergeron, CMA Sent: 03/11/2019   5:56 PM EST To: C[facility] Sleep Studies Subject: precert                                        recommend CPAP titration

## 2019-03-22 NOTE — Telephone Encounter (Signed)
Patient is scheduled for CPAP Titration on 04/02/19. Dominic Davies is scheduled for COVID screening on 03/30/19 12 pm prior to titration.  Patient understands his titration study will be done at [facility] sleep lab. Patient understands he will receive a letter in a week or so detailing appointment, date, time, and location. Patient understands to call if he does not receive the letter  in a timely manner. Left detailed message on voicemail with date and time of titration and informed patient to call back to confirm or reschedule.

## 2019-03-26 ENCOUNTER — Telehealth: Payer: Self-pay | Admitting: Cardiology

## 2019-03-26 NOTE — Telephone Encounter (Signed)
New message:     Patient calling he states he can not make these 2 appt's he has and would like for some one to call him to discuss this matter.

## 2019-03-27 NOTE — Telephone Encounter (Signed)
Called patient lmtcb on his cell. Called sleep lab and lm to call the patient back Wednesday.

## 2019-03-30 ENCOUNTER — Other Ambulatory Visit (HOSPITAL_COMMUNITY): Payer: BC Managed Care – PPO

## 2019-04-02 ENCOUNTER — Encounter (HOSPITAL_BASED_OUTPATIENT_CLINIC_OR_DEPARTMENT_OTHER): Payer: BC Managed Care – PPO | Admitting: Cardiology

## 2019-04-08 ENCOUNTER — Other Ambulatory Visit (HOSPITAL_COMMUNITY)
Admission: RE | Admit: 2019-04-08 | Discharge: 2019-04-08 | Disposition: A | Discharge status: Home / Self Care | Payer: BC Managed Care – PPO | Source: Ambulatory Visit | Attending: Cardiology | Admitting: Cardiology

## 2019-04-08 DIAGNOSIS — Z20828 Contact with and (suspected) exposure to other viral communicable diseases: Secondary | ICD-10-CM | POA: Diagnosis not present

## 2019-04-08 DIAGNOSIS — Z01812 Encounter for preprocedural laboratory examination: Secondary | ICD-10-CM | POA: Insufficient documentation

## 2019-04-08 LAB — SARS CORONAVIRUS 2 (TAT 6-24 HRS): SARS Coronavirus 2: NEGATIVE

## 2019-04-09 ENCOUNTER — Other Ambulatory Visit (HOSPITAL_BASED_OUTPATIENT_CLINIC_OR_DEPARTMENT_OTHER): Payer: Self-pay

## 2019-04-09 DIAGNOSIS — G4733 Obstructive sleep apnea (adult) (pediatric): Secondary | ICD-10-CM

## 2019-04-10 ENCOUNTER — Other Ambulatory Visit: Payer: Self-pay

## 2019-04-10 ENCOUNTER — Ambulatory Visit (HOSPITAL_BASED_OUTPATIENT_CLINIC_OR_DEPARTMENT_OTHER): Payer: BC Managed Care – PPO | Attending: Cardiology | Admitting: Cardiology

## 2019-04-10 VITALS — Ht 72.0 in | Wt 185.0 lb

## 2019-04-10 DIAGNOSIS — G4733 Obstructive sleep apnea (adult) (pediatric): Secondary | ICD-10-CM | POA: Insufficient documentation

## 2019-04-11 NOTE — Procedures (Signed)
   Patient Name: Dominic Davies, Dur[place] Date: 04/10/2019 Gender: Male D.O.B: [birth year] Age (years): 53 Referring Provider: Jenkins Rouge Height (inches): 72 Interpreting Physician: Fransico Him MD, ABSM Weight (lbs): 185 RPSGT: Zadie Rhine BMI: 25 MRN: [id number] Neck Size: 15.50  CLINICAL INFORMATION The patient is referred for a CPAP titration to treat sleep apnea.  SLEEP STUDY TECHNIQUE As per the AASM Manual for the Scoring of Sleep and Associated Events v2.3 (April 2016) with a hypopnea requiring 4% desaturations.  The channels recorded and monitored were frontal, central and occipital EEG, electrooculogram (EOG), submentalis EMG (chin), nasal and oral airflow, thoracic and abdominal wall motion, anterior tibialis EMG, snore microphone, electrocardiogram, and pulse oximetry. Continuous positive airway pressure (CPAP) was initiated at the beginning of the study and titrated to treat sleep-disordered breathing.  MEDICATIONS Medications self-administered by patient taken the night of the study : N/A  TECHNICIAN COMMENTS Comments added by technician: NO RESTROOM VISTED Comments added by scorer: N/A  RESPIRATORY PARAMETERS Optimal PAP Pressure (cm): 6  AHI at Optimal Pressure (/hr):0.0 Overall Minimal O2 (%):92.0  Supine % at Optimal Pressure (%): 100 Minimal O2 at Optimal Pressure (%): 92.0   SLEEP ARCHITECTURE The study was initiated at 10:22:43 PM and ended at 4:18:21 AM.  Sleep onset time was 8.7 minutes and the sleep efficiency was 86.9%. The total sleep time was 309 minutes.  The patient spent 3.1% of the night in stage N1 sleep, 72.8% in stage N2 sleep, 1.6% in stage N3 and 22.5% in REM.Stage REM latency was 115.0 minutes  Wake after sleep onset was 37.9. Alpha intrusion was absent. Supine sleep was 73.62%.  CARDIAC DATA The 2 lead EKG demonstrated sinus rhythm. The mean heart rate was 59.7 beats per minute. Other EKG findings include: None.  LEG MOVEMENT DATA  The total Periodic Limb Movements of Sleep (PLMS) were 0. The PLMS index was 0.0. A PLMS index of <15 is considered normal in adults.  IMPRESSIONS - The optimal PAP pressure was 6 cm of water. - Central sleep apnea was not noted during this titration (CAI = 0.0/h). - Significant oxygen desaturations were not observed during this titration (min O2 = 92.0%). - No snoring was audible during this study. - No cardiac abnormalities were observed during this study. - Clinically significant periodic limb movements were not noted during this study. Arousals associated with PLMs were rare.  DIAGNOSIS - Obstructive Sleep Apnea (327.23 [G47.33 ICD-10])  RECOMMENDATIONS - Trial of CPAP therapy on 6 cm H2O with a Medium size Philips Respironics Nasal Pillow Mask Nuance Pro Gel mask and heated humidification. - Avoid alcohol, sedatives and other CNS depressants that may worsen sleep apnea and disrupt normal sleep architecture. - Sleep hygiene should be reviewed to assess factors that may improve sleep quality. - Weight management and regular exercise should be initiated or continued. - Return to [facility] for re-evaluation after 10 weeks of therapy

## 2019-04-12 ENCOUNTER — Telehealth: Payer: Self-pay | Admitting: *Deleted

## 2019-04-12 NOTE — Telephone Encounter (Signed)
-----   Message from Sueanne Margarita, MD sent at 04/11/2019  8:39 AM EST ----- Please let patient know that they had a successful PAP titration and let DME know that orders are in EPIC.  Please set up 10 week OV with me.

## 2019-04-12 NOTE — Telephone Encounter (Addendum)
Patient wants to talk with CHM before I send over his cpap order.

## 2019-04-16 NOTE — Telephone Encounter (Signed)
Patients appointment was changed to 04/10/19 and he completed his titraton study.

## 2019-05-28 NOTE — Telephone Encounter (Signed)
I cannot order supplies unless he follows with me.  He needs a followup OV in 10 weeks or I cannot order his supplies

## 2019-05-28 NOTE — Telephone Encounter (Signed)
Patient got a cpap from his father and he is using that and states it is working good for him for now. He will call [employer] to get his supplies started. He will call our office if he needs [place] in the future.

## 2019-06-03 NOTE — Telephone Encounter (Signed)
Pt is aware and agreeable to recommendations and will call back with the date he started his cpap to schedule his 10 week follow up appointment.

## 2019-06-14 DIAGNOSIS — R509 Fever, unspecified: Secondary | ICD-10-CM | POA: Diagnosis not present

## 2019-06-14 DIAGNOSIS — Z20822 Contact with and (suspected) exposure to covid-19: Secondary | ICD-10-CM | POA: Diagnosis not present

## 2019-06-18 ENCOUNTER — Emergency Department (HOSPITAL_COMMUNITY): Payer: BC Managed Care – PPO

## 2019-06-18 ENCOUNTER — Other Ambulatory Visit: Payer: Self-pay

## 2019-06-18 ENCOUNTER — Emergency Department (HOSPITAL_COMMUNITY)
Admission: EM | Admit: 2019-06-18 | Discharge: 2019-06-19 | Disposition: A | Discharge status: Home / Self Care | Payer: BC Managed Care – PPO | Attending: Emergency Medicine | Admitting: Emergency Medicine

## 2019-06-18 ENCOUNTER — Encounter (HOSPITAL_COMMUNITY): Payer: Self-pay | Admitting: Emergency Medicine

## 2019-06-18 DIAGNOSIS — J189 Pneumonia, unspecified organism: Secondary | ICD-10-CM | POA: Diagnosis not present

## 2019-06-18 DIAGNOSIS — Z20822 Contact with and (suspected) exposure to covid-19: Secondary | ICD-10-CM | POA: Diagnosis not present

## 2019-06-18 DIAGNOSIS — R911 Solitary pulmonary nodule: Secondary | ICD-10-CM | POA: Diagnosis not present

## 2019-06-18 DIAGNOSIS — Z79899 Other long term (current) drug therapy: Secondary | ICD-10-CM | POA: Insufficient documentation

## 2019-06-18 DIAGNOSIS — R509 Fever, unspecified: Secondary | ICD-10-CM | POA: Diagnosis not present

## 2019-06-18 DIAGNOSIS — R05 Cough: Secondary | ICD-10-CM | POA: Diagnosis not present

## 2019-06-18 DIAGNOSIS — R918 Other nonspecific abnormal finding of lung field: Secondary | ICD-10-CM | POA: Diagnosis not present

## 2019-06-18 LAB — CBC
HCT: 38.1 % — ABNORMAL LOW (ref 39.0–52.0)
Hemoglobin: 12.8 g/dL — ABNORMAL LOW (ref 13.0–17.0)
MCH: 29.6 pg (ref 26.0–34.0)
MCHC: 33.6 g/dL (ref 30.0–36.0)
MCV: 88.2 fL (ref 80.0–100.0)
Platelets: 205 10*3/uL (ref 150–400)
RBC: 4.32 MIL/uL (ref 4.22–5.81)
RDW: 12.4 % (ref 11.5–15.5)
WBC: 5.3 10*3/uL (ref 4.0–10.5)
nRBC: 0 % (ref 0.0–0.2)

## 2019-06-18 LAB — BASIC METABOLIC PANEL
Anion gap: 10 (ref 5–15)
BUN: 14 mg/dL (ref 6–20)
CO2: 27 mmol/L (ref 22–32)
Calcium: 8.6 mg/dL — ABNORMAL LOW (ref 8.9–10.3)
Chloride: 104 mmol/L (ref 98–111)
Creatinine, Ser: 1.01 mg/dL (ref 0.61–1.24)
GFR calc Af Amer: >60 mL/min (ref >60)
GFR calc non Af Amer: >60 mL/min (ref >60)
Glucose, Bld: 105 mg/dL — ABNORMAL HIGH (ref 70–99)
Potassium: 3.9 mmol/L (ref 3.5–5.1)
Sodium: 141 mmol/L (ref 135–145)

## 2019-06-18 MED ORDER — IOHEXOL 350 MG/ML SOLN
100.0000 mL | Freq: Once | INTRAVENOUS | Status: AC | PRN
  Administered 2019-06-18: 100 mL via INTRAVENOUS

## 2019-06-18 MED ORDER — SODIUM CHLORIDE (PF) 0.9 % IJ SOLN
INTRAMUSCULAR | Status: AC
  Filled 2019-06-18: qty 50

## 2019-06-18 NOTE — ED Triage Notes (Signed)
Patient here from Urgent Care with complaints of fever, generalized body aches, and cough x3 days. Chest Xray completed, recommended CT scan. COVID -.

## 2019-06-18 NOTE — ED Provider Notes (Addendum)
[facility] DEPT Provider Note   CSN: [id number] Arrival date & time: 06/18/19  2046     History Chief Complaint  Patient presents with  . Fever  . Generalized Body Aches    Dominic Davies is a 54 y.o. male.  Who presents emergency department sent in from urgent CT chest.  Patient states that he has had 1 week of persistent cough, body aches, chills worsening over the past week.  Has had several Covid test that have been negative.  Patient states that at the urgent care they saw "something abnormal" on his chest x-ray.  He is unsure what it was but they can states he needed to come in for CT scan.  Patient denies any hemoptysis, unilateral leg swelling, change in the P clear moderate is not smoker.  Respiratory.  HPI     Past Medical History:  Diagnosis Date  . Irregular heart beat     Patient Active Problem List   Diagnosis Date Noted  . Chest pain 05/06/2014  . Palpitations 05/06/2014    Past Surgical History:  Procedure Laterality Date  . APPENDECTOMY  1997  . CARDIOVERSION    . INGUINAL HERNIA REPAIR Left 09/2011       Family History  Problem Relation Age of Onset  . Parkinson's disease Father   . Heart attack Other   . Heart disease Other   . Stroke Other   . Hypertension Other   . CAD Other     Social History   Tobacco Use  . Smoking status: Never Smoker  . Smokeless tobacco: Never Used  Substance Use Topics  . Alcohol use: No    Alcohol/week: 0.0 standard drinks  . Drug use: No    Home Medications Prior to Admission medications   Medication Sig Start Date End Date Taking? Authorizing Provider  acetaminophen (TYLENOL) 500 MG tablet Take 1,000 mg by mouth every 6 (six) hours as needed for mild pain.   Yes [provider]  cholecalciferol (VITAMIN D3) 25 MCG (1000 UNIT) tablet Take 1,000 Units by mouth daily.   Yes [provider]  lisinopril (ZESTRIL) 20 MG tablet Take 1 tablet (20 mg total) by mouth  daily. 03/06/19  Yes Josue Hector, MD  metoprolol tartrate (LOPRESSOR) 25 MG tablet Take 1 tablet (25 mg total) by mouth 2 (two) times daily. 08/14/18  Yes Josue Hector, MD  OVER THE COUNTER MEDICATION Take 1 tablet by mouth daily.    Yes [provider]  zinc gluconate 50 MG tablet Take 50 mg by mouth daily.   Yes [provider]    Allergies    Patient has no known allergies.  Review of Systems   Review of Systems Ten systems reviewed and are negative for acute change, except as noted in the HPI.   Physical Exam Updated Vital Signs BP (!) 147/88 (BP Location: Left Arm)   Pulse 75   Temp 98.5 F (36.9 C) (Oral)   Resp 16   Ht 6' (1.829 m)   Wt 86.2 kg   SpO2 96%   BMI 25.77 kg/m   Physical Exam Physical Exam  Nursing note and vitals reviewed. Constitutional: He appears well-developed and well-nourished. No distress.  HENT:  Head: Normocephalic and atraumatic.  Eyes: Conjunctivae normal are normal. No scleral icterus.  Neck: Normal range of motion. Neck supple.  Cardiovascular: Normal rate, regular rhythm and normal heart sounds.   Pulmonary/Chest: Effort normal and breath sounds normal. No  respiratory distress.  Abdominal: Soft. There is no tenderness.  Musculoskeletal: He exhibits no edema.  Neurological: He is alert.  Skin: Skin is warm and dry. He is not diaphoretic.  Psychiatric: His behavior is normal.   ED Results / Procedures / Treatments   Labs (all labs ordered are listed, but only abnormal results are displayed) Labs Reviewed - No data to display  EKG None  Radiology No results found.  Procedures Procedures (including critical care time)  Medications Ordered in ED Medications - No data to display  ED Course  I have reviewed the triage vital signs and the nursing notes.  Pertinent labs & imaging results that were available during my care of the patient were reviewed by me and considered in my medical decision making (see  chart for details).  Clinical Course as of Jun 18 213  Wed Jun 19, 2019  0052 Patient declines testing    [AH]    Clinical Course User Index [AH] Margarita Mail, PA-C   MDM Rules/Calculators/A&P                     Patient here with complaint of flulike symptoms for about 1 week.  He was sent in from an urgent care for further evaluation after abnormal chest x-ray.  I personally reviewed the patient's labs which shows mildly elevated glucose low calcium of insignificant value.  CBC shows no elevated white blood cell count.  Hemoglobin slightly low but also of insignificant value.  Patient declines any further Covid testing as he has had 4 tests this week.  I personally viewed the patient's CT angiogram which shows no evidence of pulmonary embolus but does show multifocal pneumonia.  Despite the patient's negative Covid test I still have high suspicion that this is his underlying diagnosis.  He is hemodynamically stable here with normal oxygen saturations on room air.  He does seem to have some reactive airway.  Patient be discharged with  azithromycin, Medrol Dosepak, and albuterol along with Tessalon.  He is advised to return immediately for any worsening symptoms.  He is also advised to quarantine at home per [facility] guidelines.  Patient appears appropriate for discharge at this time and is agreement with plan.     [name] was evaluated in Emergency Department on 06/19/2019 for the symptoms described in the history of present illness. He was evaluated in the context of the global COVID-19 pandemic, which necessitated consideration that the patient might be at risk for infection with the SARS-CoV-2 virus that causes COVID-19. Institutional protocols and algorithms that pertain to the evaluation of patients at risk for COVID-19 are in a state of rapid change based on information released by regulatory bodies including the CDC and federal and state organizations. These policies and algorithms  were followed during the patient's care in the ED.  Final Clinical Impression(s) / ED Diagnoses Final diagnoses:  None    Rx / DC Orders ED Discharge Orders    None       Margarita Mail, PA-C 06/19/19 0216    Margarita Mail, PA-C 06/19/19 0217    Ezequiel Essex, MD 06/19/19 [phone]

## 2019-06-19 DIAGNOSIS — R0602 Shortness of breath: Secondary | ICD-10-CM | POA: Diagnosis not present

## 2019-06-19 MED ORDER — METHYLPREDNISOLONE 4 MG PO TBPK: ORAL_TABLET | ORAL | 0 refills | Status: DC

## 2019-06-19 MED ORDER — AZITHROMYCIN 250 MG PO TABS: 250.0000 mg | ORAL_TABLET | Freq: Every day | ORAL | 0 refills | Status: DC

## 2019-06-19 MED ORDER — AEROCHAMBER Z-STAT PLUS/MEDIUM MISC
1.0000 | Freq: Once | Status: AC
  Administered 2019-06-19: 01:00:00 1
  Filled 2019-06-19: qty 1

## 2019-06-19 MED ORDER — ALBUTEROL SULFATE HFA 108 (90 BASE) MCG/ACT IN AERS
2.0000 | INHALATION_SPRAY | Freq: Once | RESPIRATORY_TRACT | Status: AC
  Administered 2019-06-19: 01:00:00 2 via RESPIRATORY_TRACT
  Filled 2019-06-19: qty 6.7

## 2019-06-19 MED ORDER — BENZONATATE 100 MG PO CAPS: 100.0000 mg | ORAL_CAPSULE | Freq: Three times a day (TID) | ORAL | 0 refills | Status: DC | PRN

## 2019-06-19 NOTE — Discharge Instructions (Addendum)
Use your inhaler 2 puffs every 4 hours as needed for cough and wheezing. Follow the isolation precautions     Person Under Monitoring Name: Dominic Davies  Location: [place] [address]   Infection Prevention Recommendations for Individuals Confirmed to have, or Being Evaluated for, 2019 Novel Coronavirus (COVID-19) Infection Who Receive Care at Home  Individuals who are confirmed to have, or are being evaluated for, COVID-19 should follow the prevention steps below until a healthcare provider or local or state health department says they can return to normal activities.  Stay home except to get medical care You should restrict activities outside your home, except for getting medical care. Do not go to work, school, or public areas, and do not use public transportation or taxis.  Call ahead before visiting your doctor Before your medical appointment, call the healthcare provider and tell them that you have, or are being evaluated for, COVID-19 infection. This will help the healthcare provider's office take steps to keep other people from getting infected. Ask your healthcare provider to call the local or state health department.  Monitor your symptoms Seek prompt medical attention if your illness is worsening (e.g., difficulty breathing). Before going to your medical appointment, call the healthcare provider and tell them that you have, or are being evaluated for, COVID-19 infection. Ask your healthcare provider to call the local or state health department.  Wear a facemask You should wear a facemask that covers your nose and mouth when you are in the same room with other people and when you visit a healthcare provider. People who live with or visit you should also wear a facemask while they are in the same room with you.  Separate yourself from other people in your home As much as possible, you should stay in a different room from other people in your home.  Also, you should use a separate bathroom, if available.  Avoid sharing household items You should not share dishes, drinking glasses, cups, eating utensils, towels, bedding, or other items with other people in your home. After using these items, you should wash them thoroughly with soap and water.  Cover your coughs and sneezes Cover your mouth and nose with a tissue when you cough or sneeze, or you can cough or sneeze into your sleeve. Throw used tissues in a lined trash can, and immediately wash your hands with soap and water for at least 20 seconds or use an alcohol-based hand rub.  Wash your [facility] your hands often and thoroughly with soap and water for at least 20 seconds. You can use an alcohol-based hand sanitizer if soap and water are not available and if your hands are not visibly dirty. Avoid touching your eyes, nose, and mouth with unwashed hands.   Prevention Steps for Caregivers and Household Members of Individuals Confirmed to have, or Being Evaluated for, COVID-19 Infection Being Cared for in the Home  If you live with, or provide care at home for, a person confirmed to have, or being evaluated for, COVID-19 infection please follow these guidelines to prevent infection:  Follow healthcare provider's instructions Make sure that you understand and can help the patient follow any healthcare provider instructions for all care.  Provide for the patient's basic needs You should help the patient with basic needs in the home and provide support for getting groceries, prescriptions, and other personal needs.  Monitor the patient's symptoms If they are getting sicker, call his or her medical provider and  tell them that the patient has, or is being evaluated for, COVID-19 infection. This will help the healthcare provider's office take steps to keep other people from getting infected. Ask the healthcare provider to call the local or state health department.  Limit the  number of people who have contact with the patient If possible, have only one caregiver for the patient. Other household members should stay in another home or place of residence. If this is not possible, they should stay in another room, or be separated from the patient as much as possible. Use a separate bathroom, if available. Restrict visitors who do not have an essential need to be in the home.  Keep older adults, very young children, and other sick people away from the patient Keep older adults, very young children, and those who have compromised immune systems or chronic health conditions away from the patient. This includes people with chronic heart, lung, or kidney conditions, diabetes, and cancer.  Ensure good ventilation Make sure that shared spaces in the home have good air flow, such as from an air conditioner or an opened window, weather permitting.  Wash your hands often Wash your hands often and thoroughly with soap and water for at least 20 seconds. You can use an alcohol based hand sanitizer if soap and water are not available and if your hands are not visibly dirty. Avoid touching your eyes, nose, and mouth with unwashed hands. Use disposable paper towels to dry your hands. If not available, use dedicated cloth towels and replace them when they become wet.  Wear a facemask and gloves Wear a disposable facemask at all times in the room and gloves when you touch or have contact with the patient's blood, body fluids, and/or secretions or excretions, such as sweat, saliva, sputum, nasal mucus, vomit, urine, or feces.  Ensure the mask fits over your nose and mouth tightly, and do not touch it during use. Throw out disposable facemasks and gloves after using them. Do not reuse. Wash your hands immediately after removing your facemask and gloves. If your personal clothing becomes contaminated, carefully remove clothing and launder. Wash your hands after handling contaminated  clothing. Place all used disposable facemasks, gloves, and other waste in a lined container before disposing them with other household waste. Remove gloves and wash your hands immediately after handling these items.  Do not share dishes, glasses, or other household items with the patient Avoid sharing household items. You should not share dishes, drinking glasses, cups, eating utensils, towels, bedding, or other items with a patient who is confirmed to have, or being evaluated for, COVID-19 infection. After the person uses these items, you should wash them thoroughly with soap and water.  Wash laundry thoroughly Immediately remove and wash clothes or bedding that have blood, body fluids, and/or secretions or excretions, such as sweat, saliva, sputum, nasal mucus, vomit, urine, or feces, on them. Wear gloves when handling laundry from the patient. Read and follow directions on labels of laundry or clothing items and detergent. In general, wash and dry with the warmest temperatures recommended on the label.  Clean all areas the individual has used often Clean all touchable surfaces, such as counters, tabletops, doorknobs, bathroom fixtures, toilets, phones, keyboards, tablets, and bedside tables, every day. Also, clean any surfaces that may have blood, body fluids, and/or secretions or excretions on them. Wear gloves when cleaning surfaces the patient has come in contact with. Use a diluted bleach solution (e.g., dilute bleach with 1 part bleach  and 10 parts water) or a household disinfectant with a label that says EPA-registered for coronaviruses. To make a bleach solution at home, add 1 tablespoon of bleach to 1 quart (4 cups) of water. For a larger supply, add  cup of bleach to 1 gallon (16 cups) of water. Read labels of cleaning products and follow recommendations provided on product labels. Labels contain instructions for safe and effective use of the cleaning product including precautions you  should take when applying the product, such as wearing gloves or eye protection and making sure you have good ventilation during use of the product. Remove gloves and wash hands immediately after cleaning.  Monitor yourself for signs and symptoms of illness Caregivers and household members are considered close contacts, should monitor their health, and will be asked to limit movement outside of the home to the extent possible. Follow the monitoring steps for close contacts listed on the symptom monitoring form.   ? If you have additional questions, contact your local health department or call the epidemiologist on call at [phone] (available 24/7). ? This guidance is subject to change. For the most up-to-date guidance from [facility], please refer to their website: [employer]Blogs.pl

## 2019-07-17 ENCOUNTER — Telehealth: Payer: Self-pay | Admitting: *Deleted

## 2019-07-17 NOTE — Telephone Encounter (Signed)
Patient has a 10 week follow up appointment scheduled for 08/23/19. Patient understands he needs to keep this appointment for insurance compliance. Patient was grateful for the call and thanked me.

## 2019-07-17 NOTE — Telephone Encounter (Signed)

## 2019-07-30 DIAGNOSIS — Z20828 Contact with and (suspected) exposure to other viral communicable diseases: Secondary | ICD-10-CM | POA: Diagnosis not present

## 2019-08-22 NOTE — Progress Notes (Signed)
Virtual Visit via Video Note   This visit type was conducted due to national recommendations for restrictions regarding the COVID-19 Pandemic (e.g. social distancing) in an effort to limit this patient's exposure and mitigate transmission in our community.  Due to his co-morbid illnesses, this patient is at least at moderate risk for complications without adequate follow up.  This format is felt to be most appropriate for this patient at this time.  All issues noted in this document were discussed and addressed.  A limited physical exam was performed with this format.  Please refer to the patient's chart for his consent to telehealth for [facility].  Evaluation Performed:  Follow-up visit  This visit type was conducted due to national recommendations for restrictions regarding the COVID-19 Pandemic (e.g. social distancing).  This format is felt to be most appropriate for this patient at this time.  All issues noted in this document were discussed and addressed.  No physical exam was performed (except for noted visual exam findings with Video Visits).  Please refer to the patient's chart (MyChart message for video visits and phone note for telephone visits) for the patient's consent to telehealth for [facility].  Date:  08/23/2019   ID:  Dominic Davies, DOB 1965-11-30, MRN [id number]  Patient Location:  [place]  Provider location:   [facility]  PCP:  Patient, No Pcp Per  Cardiologist:  Jenkins Rouge, MD  Sleep Medicine:  Fransico Him, MD Electrophysiologist:  None   Chief Complaint:  OSA  History of Present Illness:    Dominic Davies is a 54 y.o. male who presents via audio/video conferencing for a telehealth visit today.    Dominic Davies is a 54 y.o. male with a hx of HTN, PAF and recently saw Dr. Johnsie Cancel and was concerned that he might have OSA because he was told he stops breathing a lot a night and has snoring by his family.  He also says that some days he feels like he needs  to lay down and take a nap.  He can easily fall asleep if he is a passenger in a car but has not had problems when driving. Marland Kitchen He was found to have mild OSA with an AHI of 9.9/hr and O2 sats as low as 92%.  He underwent CPAP titration to 6cm H2O and is now here for followup.  He is doing well with his CPAP device and thinks that he has gotten used to it.  he tolerates the mask and feels the pressure is adequate.  Since going on CPAP he feels rested in the am and has no significant daytime sleepiness.  He denies any significant mouth or nasal dryness or nasal congestion. He does complain of some dryness on the inside of his throat near his ear.  He does not think that he snores.    The patient does not have symptoms concerning for COVID-19 infection (fever, chills, cough, or new shortness of breath).    Prior CV studies:   The following studies were reviewed today:  Sleep study, CPAP titration and PAP compliance download  Past Medical History:  Diagnosis Date  . Irregular heart beat    Past Surgical History:  Procedure Laterality Date  . APPENDECTOMY  1997  . CARDIOVERSION    . INGUINAL HERNIA REPAIR Left 09/2011     Current Meds  Medication Sig  . acetaminophen (TYLENOL) 500 MG tablet Take 1,000 mg by mouth every 6 (six) hours as needed for mild  pain.  . lisinopril (ZESTRIL) 20 MG tablet Take 1 tablet (20 mg total) by mouth daily.  . metoprolol tartrate (LOPRESSOR) 25 MG tablet Take 1 tablet (25 mg total) by mouth 2 (two) times daily.     Allergies:   Patient has no known allergies.   Social History   Tobacco Use  . Smoking status: Never Smoker  . Smokeless tobacco: Never Used  Substance Use Topics  . Alcohol use: No    Alcohol/week: 0.0 standard drinks  . Drug use: No     Family Hx: The patient's family history includes CAD in an other family member; Heart attack in an other family member; Heart disease in an other family member; Hypertension in an other family member;  Parkinson's disease in his father; Stroke in an other family member.  ROS:   Please see the history of present illness.     All other systems reviewed and are negative.   Labs/Other Tests and Data Reviewed:    Recent Labs: 06/18/2019: BUN 14; Creatinine, Ser 1.01; Hemoglobin 12.8; Platelets 205; Potassium 3.9; Sodium 141   Recent Lipid Panel No results found for: CHOL, TRIG, HDL, CHOLHDL, LDLCALC, LDLDIRECT  Wt Readings from Last 3 Encounters:  08/23/19 191 lb (86.6 kg)  06/18/19 190 lb (86.2 kg)  04/10/19 185 lb (83.9 kg)     Objective:    Vital Signs:  BP (!) 144/86   Pulse 61   Ht 6' (1.829 m)   Wt 191 lb (86.6 kg)   BMI 25.90 kg/m     ASSESSMENT & PLAN:    1.  OSA - The pathophysiology of obstructive sleep apnea , it's cardiovascular consequences & modes of treatment including CPAP were discused with the patient in detail & they evidenced understanding.  The patient is tolerating PAP therapy well without any problems. The PAP download was reviewed today and showed an AHI of 0.3/hr on 6 cm H2O with 67% compliance in using more than 4 hours nightly.  The patient has been using and benefiting from PAP use and will continue to benefit from therapy.   2.  HTN -BP controlled -continue Lisinopril 20mg  daily and Lopressor 25mg  BID  COVID-19 Education: The signs and symptoms of COVID-19 were discussed with the patient and how to seek care for testing (follow up with PCP or arrange E-visit).  The importance of social distancing was discussed today.  Patient Risk:   After full review of this patient's clinical status, I feel that they are at least moderate risk at this time.  Time:   Today, I have spent 20 minutes on telemedicine discussing medical problems including OSA, HTN and reviewing patient's chart including sleep study, CPAP titration and PAP compliance download.  Medication Adjustments/Labs and Tests Ordered: Current medicines are reviewed at length with the patient  today.  Concerns regarding medicines are outlined above.  Tests Ordered: No orders of the defined types were placed in this encounter.  Medication Changes: No orders of the defined types were placed in this encounter.   Disposition:  Follow up in 1 year(s)  Signed, Fransico Him, MD  08/23/2019 8:21 AM    [facility]

## 2019-08-23 ENCOUNTER — Telehealth: Payer: Self-pay | Admitting: *Deleted

## 2019-08-23 ENCOUNTER — Other Ambulatory Visit: Payer: Self-pay

## 2019-08-23 ENCOUNTER — Telehealth (INDEPENDENT_AMBULATORY_CARE_PROVIDER_SITE_OTHER): Payer: BC Managed Care – PPO | Admitting: Cardiology

## 2019-08-23 ENCOUNTER — Encounter: Payer: Self-pay | Admitting: Cardiology

## 2019-08-23 VITALS — BP 144/86 | HR 61 | Ht 72.0 in | Wt 191.0 lb

## 2019-08-23 DIAGNOSIS — G4733 Obstructive sleep apnea (adult) (pediatric): Secondary | ICD-10-CM

## 2019-08-23 DIAGNOSIS — I10 Essential (primary) hypertension: Secondary | ICD-10-CM

## 2019-08-23 NOTE — Telephone Encounter (Addendum)
Order placed to [facility] for supplies and chin strap. 1 year recall made.

## 2019-08-23 NOTE — Telephone Encounter (Signed)
-----   Message from Sueanne Margarita, MD sent at 08/23/2019  8:23 AM EDT ----- Please set patient up with [facility] for supplies.  He is using his Dad's old PAP machine.  He needs a chin strap as well.  Followup with me in 1 year

## 2019-08-29 ENCOUNTER — Other Ambulatory Visit: Payer: Self-pay | Admitting: Cardiovascular Disease

## 2020-02-05 DIAGNOSIS — S93601A Unspecified sprain of right foot, initial encounter: Secondary | ICD-10-CM | POA: Diagnosis not present

## 2020-02-17 NOTE — Progress Notes (Signed)
Date:  02/27/2020   ID:  Dominic Davies, DOB 06-Feb-1966, MRN [id number]   PCP:  Patient, No Pcp Per  Cardiologist:  Johnsie Cancel Electrophysiologist:  None   Chief Complaint:  Palpitations   History of Present Illness:    Dominic Davies is a 54 y.o. male first seen 07/05/18 for HTN and palpitations Had PAF in 2015 quiescent for years. CHADVASC 1 normal echo 2016 Rx with PRN inderal Palpitations more likely PVCls now and improved with beta blocker Event monitor 07/11/18 benign. Seeing Dr Radford Pax now for OSA/CPAP   He does software sales related to [employer] No ETOH and limited caffeine  Echo reviewed 10/16/18 EF 60-65% no valve disease ECG 07/05/18 normal SB rate 59   Had cough and dyspnea February 2021 seen in ED and had multiple negative COVID tests. CT with LLL multifocal pneumonia Rx with azithromycin and medrol dosepak and albuterol   He has not been vaccinated Ab's positive Discussed need to get vaccine and f/u Chest CT   Has had a bad 2 months. Niece and sister have been killed in car accidents and father died of stroke BP has been a bit higher as expected    Prior CV studies:   The following studies were reviewed today:  07/11/18 Event monitor NSR PVC;s no significant arrhythmia  Past Medical History:  Diagnosis Date  . Irregular heart beat    Past Surgical History:  Procedure Laterality Date  . APPENDECTOMY  1997  . CARDIOVERSION    . INGUINAL HERNIA REPAIR Left 09/2011     Current Meds  Medication Sig  . cholecalciferol (VITAMIN D3) 25 MCG (1000 UNIT) tablet Take 1,000 Units by mouth daily.  Marland Kitchen lisinopril (ZESTRIL) 20 MG tablet Take 1 tablet (20 mg total) by mouth daily.  . metoprolol tartrate (LOPRESSOR) 25 MG tablet TAKE 1 TABLET BY MOUTH TWICE A DAY     Allergies:   Patient has no known allergies.   Social History   Tobacco Use  . Smoking status: Never Smoker  . Smokeless tobacco: Never Used  Vaping Use  . Vaping Use: Never used  Substance Use  Topics  . Alcohol use: No    Alcohol/week: 0.0 standard drinks  . Drug use: No     Family Hx: The patient's family history includes CAD in an other family member; Heart attack in an other family member; Heart disease in an other family member; Hypertension in an other family member; Parkinson's disease in his father; Stroke in an other family member.  ROS:   Please see the history of present illness.     All other systems reviewed and are negative.   Labs/Other Tests and Data Reviewed:    Recent Labs: 06/18/2019: BUN 14; Creatinine, Ser 1.01; Hemoglobin 12.8; Platelets 205; Potassium 3.9; Sodium 141   Recent Lipid Panel No results found for: CHOL, TRIG, HDL, CHOLHDL, LDLCALC, LDLDIRECT  Wt Readings from Last 3 Encounters:  02/27/20 195 lb 6.4 oz (88.6 kg)  08/23/19 191 lb (86.6 kg)  06/18/19 190 lb (86.2 kg)     Objective:    Vital Signs:  BP (!) 144/90   Pulse 73   Ht 6' (1.829 m)   Wt 195 lb 6.4 oz (88.6 kg)   SpO2 97%   BMI 26.50 kg/m    Affect appropriate Healthy:  appears stated age HEENT: normal Neck supple with no adenopathy JVP normal no bruits no thyromegaly Lungs clear with no wheezing and good diaphragmatic motion Heart:  S1/S2 no murmur, no rub, gallop or click PMI normal Abdomen: benighn, BS positve, no tenderness, no AAA no bruit.  No HSM or HJR Distal pulses intact with no bruits No edema Neuro non-focal Skin warm and dry No muscular weakness   ECG:  SR rate 73 normal   ASSESSMENT & PLAN:    1. HTN:  Improved on ACE and beta blocker continue low sodium diet  2. Palpitations: Distant history of PAF Monitor with PVC;s Lopressor added Improved 3. OSA:  Mildly abnormal sleep study now on CPAP f/u [place]  4. CAD:  Risk discussed utility of calcium scoring will initially see what cors look like on non contrast CT chest see below  5. Pulmonary:  Presumed COVID 06/2019 despite negative tests with abnormal CT chest LLL multifocal pneumonia Needs to  get vaccinated. F/U Chest CT non contrast ordered    Medication Adjustments/Labs and Tests Ordered: Current medicines are reviewed at length with the patient today.  Concerns regarding medicines are outlined above.  Tests Ordered:  Chest CT f/u COvID  Medication Changes: No orders of the defined types were placed in this encounter.   Disposition: F/U in a year  And Dr Radford Pax for Sleep    Signed, Jenkins Rouge, MD  02/27/2020 4:03 PM    [place]

## 2020-02-27 ENCOUNTER — Other Ambulatory Visit: Payer: Self-pay

## 2020-02-27 ENCOUNTER — Encounter: Payer: Self-pay | Admitting: Cardiovascular Disease

## 2020-02-27 ENCOUNTER — Ambulatory Visit: Payer: BC Managed Care – PPO | Admitting: Cardiovascular Disease

## 2020-02-27 VITALS — BP 144/90 | HR 73 | Ht 72.0 in | Wt 195.4 lb

## 2020-02-27 DIAGNOSIS — R002 Palpitations: Secondary | ICD-10-CM

## 2020-02-27 DIAGNOSIS — Z8679 Personal history of other diseases of the circulatory system: Secondary | ICD-10-CM

## 2020-02-27 DIAGNOSIS — I10 Essential (primary) hypertension: Secondary | ICD-10-CM | POA: Diagnosis not present

## 2020-02-27 DIAGNOSIS — Z7189 Other specified counseling: Secondary | ICD-10-CM | POA: Diagnosis not present

## 2020-02-27 DIAGNOSIS — R9389 Abnormal findings on diagnostic imaging of other specified body structures: Secondary | ICD-10-CM

## 2020-02-27 NOTE — Patient Instructions (Addendum)
Medication Instructions:  *If you need a refill on your cardiac medications before your next appointment, please call your pharmacy*  Lab Work: If you have labs (blood work) drawn today and your tests are completely normal, you will receive your results only by: Marland Kitchen MyChart Message (if you have MyChart) OR . A paper copy in the mail If you have any lab test that is abnormal or we need to change your treatment, we will call you to review the results.  Testing/Procedures: Chest CT scanning, (CAT scanning), is a noninvasive, special x-ray that produces cross-sectional images of the body using x-rays and a computer. CT scans help physicians diagnose and treat medical conditions. For some CT exams, a contrast material is used to enhance visibility in the area of the body being studied. CT scans provide greater clarity and reveal more details than regular x-ray exams.  Follow-Up: At [facility], you and your health needs are our priority.  As part of our continuing mission to provide you with exceptional heart care, we have created designated Provider Care Teams.  These Care Teams include your primary Cardiologist (physician) and Advanced Practice Providers (APPs -  Physician Assistants and Nurse Practitioners) who all work together to provide you with the care you need, when you need it.  We recommend signing up for the patient portal called "MyChart".  Sign up information is provided on this After Visit Summary.  MyChart is used to connect with patients for Virtual Visits (Telemedicine).  Patients are able to view lab/test results, encounter notes, upcoming appointments, etc.  Non-urgent messages can be sent to your provider as well.   To learn more about what you can do with MyChart, go to [email or web address].    Your next appointment:   6 month(s)  The format for your next appointment:   In Person  Provider:   You may see Jenkins Rouge, MD or one of the following Advanced Practice  Providers on your designated Care Team:    Truitt Merle, NP  Cecilie Kicks, NP  Kathyrn Drown, NP   We are recommending the COVID-19 vaccine to all of our patients. Cardiac medications (including blood thinners) should not deter you from being vaccinated and there is no need to hold any medications prior to vaccine administration.

## 2020-03-03 ENCOUNTER — Other Ambulatory Visit: Payer: Self-pay

## 2020-03-03 ENCOUNTER — Ambulatory Visit (INDEPENDENT_AMBULATORY_CARE_PROVIDER_SITE_OTHER)
Admission: RE | Admit: 2020-03-03 | Discharge: 2020-03-03 | Disposition: A | Discharge status: Home / Self Care | Payer: BC Managed Care – PPO | Source: Ambulatory Visit | Attending: Cardiovascular Disease | Admitting: Cardiovascular Disease

## 2020-03-03 DIAGNOSIS — R002 Palpitations: Secondary | ICD-10-CM | POA: Diagnosis not present

## 2020-03-03 DIAGNOSIS — I10 Essential (primary) hypertension: Secondary | ICD-10-CM

## 2020-03-03 DIAGNOSIS — R9389 Abnormal findings on diagnostic imaging of other specified body structures: Secondary | ICD-10-CM

## 2020-03-03 DIAGNOSIS — Z8679 Personal history of other diseases of the circulatory system: Secondary | ICD-10-CM

## 2020-03-03 DIAGNOSIS — Z7189 Other specified counseling: Secondary | ICD-10-CM

## 2020-03-03 DIAGNOSIS — J984 Other disorders of lung: Secondary | ICD-10-CM | POA: Diagnosis not present

## 2020-03-11 DIAGNOSIS — M79671 Pain in right foot: Secondary | ICD-10-CM | POA: Diagnosis not present

## 2020-03-17 ENCOUNTER — Other Ambulatory Visit: Payer: Self-pay | Admitting: Cardiovascular Disease

## 2020-07-11 DIAGNOSIS — N200 Calculus of kidney: Secondary | ICD-10-CM | POA: Diagnosis not present

## 2020-07-11 DIAGNOSIS — R1010 Upper abdominal pain, unspecified: Secondary | ICD-10-CM | POA: Diagnosis present

## 2020-07-12 ENCOUNTER — Telehealth: Payer: Self-pay | Admitting: Surgery

## 2020-07-12 ENCOUNTER — Emergency Department (HOSPITAL_COMMUNITY): Payer: 59

## 2020-07-12 ENCOUNTER — Emergency Department (HOSPITAL_COMMUNITY)
Admission: EM | Admit: 2020-07-12 | Discharge: 2020-07-12 | Disposition: A | Discharge status: Home / Self Care | Payer: 59 | Attending: Emergency Medicine | Admitting: Emergency Medicine

## 2020-07-12 ENCOUNTER — Encounter (HOSPITAL_COMMUNITY): Payer: Self-pay | Admitting: Emergency Medicine

## 2020-07-12 DIAGNOSIS — N200 Calculus of kidney: Secondary | ICD-10-CM

## 2020-07-12 LAB — CBC
HCT: 41.9 % (ref 39.0–52.0)
Hemoglobin: 14.7 g/dL (ref 13.0–17.0)
MCH: 30.5 pg (ref 26.0–34.0)
MCHC: 35.1 g/dL (ref 30.0–36.0)
MCV: 86.9 fL (ref 80.0–100.0)
Platelets: 292 10*3/uL (ref 150–400)
RBC: 4.82 MIL/uL (ref 4.22–5.81)
RDW: 12.4 % (ref 11.5–15.5)
WBC: 10.7 10*3/uL — ABNORMAL HIGH (ref 4.0–10.5)
nRBC: 0 % (ref 0.0–0.2)

## 2020-07-12 LAB — COMPREHENSIVE METABOLIC PANEL
ALT: 19 U/L (ref 0–44)
AST: 22 U/L (ref 15–41)
Albumin: 4 g/dL (ref 3.5–5.0)
Alkaline Phosphatase: 50 U/L (ref 38–126)
Anion gap: 12 (ref 5–15)
BUN: 18 mg/dL (ref 6–20)
CO2: 22 mmol/L (ref 22–32)
Calcium: 9.2 mg/dL (ref 8.9–10.3)
Chloride: 104 mmol/L (ref 98–111)
Creatinine, Ser: 1.25 mg/dL — ABNORMAL HIGH (ref 0.61–1.24)
GFR, Estimated: >60 mL/min (ref >60)
Glucose, Bld: 121 mg/dL — ABNORMAL HIGH (ref 70–99)
Potassium: 3.4 mmol/L — ABNORMAL LOW (ref 3.5–5.1)
Sodium: 138 mmol/L (ref 135–145)
Total Bilirubin: 0.7 mg/dL (ref 0.3–1.2)
Total Protein: 7 g/dL (ref 6.5–8.1)

## 2020-07-12 LAB — URINALYSIS, ROUTINE W REFLEX MICROSCOPIC
Bilirubin Urine: NEGATIVE
Glucose, UA: NEGATIVE mg/dL
Ketones, ur: NEGATIVE mg/dL
Leukocytes,Ua: NEGATIVE
Nitrite: NEGATIVE
Protein, ur: 30 mg/dL — AB
RBC / HPF: >50 RBC/hpf — ABNORMAL HIGH (ref 0–5)
Specific Gravity, Urine: 1.016 (ref 1.005–1.030)
pH: 6 (ref 5.0–8.0)

## 2020-07-12 LAB — LIPASE, BLOOD: Lipase: 49 U/L (ref 11–51)

## 2020-07-12 MED ORDER — OXYCODONE-ACETAMINOPHEN 5-325 MG PO TABS: 1.0000 | ORAL_TABLET | ORAL | 0 refills | Status: DC | PRN

## 2020-07-12 MED ORDER — ONDANSETRON HCL 4 MG/2ML IJ SOLN
4.0000 mg | Freq: Once | INTRAMUSCULAR | Status: DC
  Filled 2020-07-12: qty 2

## 2020-07-12 MED ORDER — KETOROLAC TROMETHAMINE 30 MG/ML IJ SOLN
30.0000 mg | Freq: Once | INTRAMUSCULAR | Status: DC
  Filled 2020-07-12: qty 1

## 2020-07-12 MED ORDER — TAMSULOSIN HCL 0.4 MG PO CAPS: 0.4000 mg | ORAL_CAPSULE | Freq: Every day | ORAL | 0 refills | Status: DC

## 2020-07-12 MED ORDER — HYDROMORPHONE HCL 1 MG/ML IJ SOLN
1.0000 mg | Freq: Once | INTRAMUSCULAR | Status: DC
  Filled 2020-07-12: qty 1

## 2020-07-12 NOTE — ED Notes (Signed)
Assumed care of this patient. Pt reports abd pain with possible kidney stones. Vitals taken. A&Ox4. Pt reports 1/10 pain. Refuses pain meds. Respirations regular/unlabored. Connected to BP, Pulse ox.

## 2020-07-12 NOTE — ED Provider Notes (Signed)
[facility] EMERGENCY DEPARTMENT Provider Note   CSN: [id number] Arrival date & time: 07/11/20  2340     History Chief Complaint  Patient presents with  . Flank Pain    Dominic Davies is a 55 y.o. male.  Patient presents to the emergency department with somewhat sudden onset left-sided back and flank pain, radiating across the upper abdomen.  Patient reports pain was quite severe on onset, has eased off.  He had associated nausea and vomiting when the pain was at its worst.  No diarrhea.  No lower abdominal pain.        Past Medical History:  Diagnosis Date  . Irregular heart beat     Patient Active Problem List   Diagnosis Date Noted  . Chest pain 05/06/2014  . Palpitations 05/06/2014    Past Surgical History:  Procedure Laterality Date  . APPENDECTOMY  1997  . CARDIOVERSION    . INGUINAL HERNIA REPAIR Left 09/2011       Family History  Problem Relation Age of Onset  . Parkinson's disease Father   . Heart attack Other   . Heart disease Other   . Stroke Other   . Hypertension Other   . CAD Other     Social History   Tobacco Use  . Smoking status: Never Smoker  . Smokeless tobacco: Never Used  Vaping Use  . Vaping Use: Never used  Substance Use Topics  . Alcohol use: No    Alcohol/week: 0.0 standard drinks  . Drug use: No    Home Medications Prior to Admission medications   Medication Sig Start Date End Date Taking? Authorizing Provider  oxyCODONE-acetaminophen (PERCOCET) 5-325 MG tablet Take 1-2 tablets by mouth every 4 (four) hours as needed. 07/12/20  Yes Dimitriy Carreras, Gwenyth Allegra, MD  tamsulosin (FLOMAX) 0.4 MG CAPS capsule Take 1 capsule (0.4 mg total) by mouth daily. 07/12/20  Yes Lissy Deuser, Gwenyth Allegra, MD  cholecalciferol (VITAMIN D3) 25 MCG (1000 UNIT) tablet Take 1,000 Units by mouth daily.    [provider]  lisinopril (ZESTRIL) 20 MG tablet TAKE 1 TABLET BY MOUTH EVERY DAY 03/17/20   Josue Hector, MD  metoprolol  tartrate (LOPRESSOR) 25 MG tablet TAKE 1 TABLET BY MOUTH TWICE A DAY 03/17/20   Josue Hector, MD    Allergies    Patient has no known allergies.  Review of Systems   Review of Systems  Gastrointestinal: Positive for nausea and vomiting.  Genitourinary: Positive for flank pain.  All other systems reviewed and are negative.   Physical Exam Updated Vital Signs BP (!) 153/82 (BP Location: Right Arm)   Pulse 75   Temp 98.3 F (36.8 C)   Resp 16   SpO2 96%   Physical Exam Vitals and nursing note reviewed.  Constitutional:      General: He is not in acute distress.    Appearance: Normal appearance. He is well-developed and well-nourished.  HENT:     Head: Normocephalic and atraumatic.     Right Ear: Hearing normal.     Left Ear: Hearing normal.     Nose: Nose normal.     Mouth/Throat:     Mouth: Oropharynx is clear and moist and mucous membranes are normal.  Eyes:     Extraocular Movements: EOM normal.     Conjunctiva/sclera: Conjunctivae normal.     Pupils: Pupils are equal, round, and reactive to light.  Cardiovascular:     Rate and Rhythm: Regular rhythm.  Heart sounds: S1 normal and S2 normal. No murmur heard. No friction rub. No gallop.   Pulmonary:     Effort: Pulmonary effort is normal. No respiratory distress.     Breath sounds: Normal breath sounds.  Chest:     Chest wall: No tenderness.  Abdominal:     General: Bowel sounds are normal.     Palpations: Abdomen is soft. There is no hepatosplenomegaly.     Tenderness: There is no abdominal tenderness. There is no guarding or rebound. Negative signs include Murphy's sign and McBurney's sign.     Hernia: No hernia is present.  Musculoskeletal:        General: Normal range of motion.     Cervical back: Normal range of motion and neck supple.  Skin:    General: Skin is warm, dry and intact.     Findings: No rash.     Nails: There is no cyanosis.  Neurological:     Mental Status: He is alert and oriented to  person, place, and time.     GCS: GCS eye subscore is 4. GCS verbal subscore is 5. GCS motor subscore is 6.     Cranial Nerves: No cranial nerve deficit.     Sensory: No sensory deficit.     Coordination: Coordination normal.     Deep Tendon Reflexes: Strength normal.  Psychiatric:        Mood and Affect: Mood and affect normal.        Speech: Speech normal.        Behavior: Behavior normal.        Thought Content: Thought content normal.     ED Results / Procedures / Treatments   Labs (all labs ordered are listed, but only abnormal results are displayed) Labs Reviewed  COMPREHENSIVE METABOLIC PANEL - Abnormal; Notable for the following components:      Result Value   Potassium 3.4 (*)    Glucose, Bld 121 (*)    Creatinine, Ser 1.25 (*)    All other components within normal limits  CBC - Abnormal; Notable for the following components:   WBC 10.7 (*)    All other components within normal limits  URINALYSIS, ROUTINE W REFLEX MICROSCOPIC - Abnormal; Notable for the following components:   APPearance HAZY (*)    Hgb urine dipstick LARGE (*)    Protein, ur 30 (*)    RBC / HPF >50 (*)    Bacteria, UA RARE (*)    All other components within normal limits  LIPASE, BLOOD    EKG None  Radiology CT Renal Stone Study  Result Date: 07/12/2020 CLINICAL DATA:  Left flank pain EXAM: CT ABDOMEN AND PELVIS WITHOUT CONTRAST TECHNIQUE: Multidetector CT imaging of the abdomen and pelvis was performed following the standard protocol without IV contrast. COMPARISON:  None. FINDINGS: Lower chest: The visualized lung bases are clear bilaterally. The visualized heart and pericardium are unremarkable. Hepatobiliary: Scattered cysts within the liver. The liver is otherwise unremarkable. Gallbladder unremarkable. No intra or extrahepatic biliary ductal dilation. Pancreas: Unremarkable Spleen: Unremarkable Adrenals/Urinary Tract: The adrenal glands are unremarkable. The kidneys are normal in size and  position. There is mild asymmetric left perinephric stranding and minimal hydroureter secondary to a 3 mm calculus seen at the left ureterovesicular junction protruding into the bladder lumen. Additional 2-3 mm punctate nonobstructing calculus noted within the interpolar region of the right kidney. No ureteral calculi on the right. No hydronephrosis. Bladder is otherwise unremarkable. Stomach/Bowel: The stomach, small  bowel, and large bowel are unremarkable. The appendix is absent. No free intraperitoneal gas or fluid. Vascular/Lymphatic: Mild atherosclerotic calcification within the abdominal aorta. No aortic aneurysm. No pathologic adenopathy within the abdomen and pelvis. Reproductive: Prostate is unremarkable. Other: Tiny fat containing umbilical hernia.  Rectum unremarkable. Musculoskeletal: No acute bone abnormality. IMPRESSION: Minimally obstructing 3 mm calculus at the left ureterovesicular junction protruding into the bladder lumen resulting in mild left hydroureter and asymmetric mild left perinephric stranding. Superimposed minimal nonobstructing right nephrolithiasis. Electronically Signed   By: Fidela Salisbury MD   On: 07/12/2020 02:00    Procedures Procedures   Medications Ordered in ED Medications  HYDROmorphone (DILAUDID) injection 1 mg (1 mg Intravenous Not Given 07/12/20 0300)  ondansetron (ZOFRAN) injection 4 mg (4 mg Intravenous Not Given 07/12/20 0300)  ketorolac (TORADOL) 30 MG/ML injection 30 mg (30 mg Intravenous Not Given 07/12/20 0300)    ED Course  I have reviewed the triage vital signs and the nursing notes.  Pertinent labs & imaging results that were available during my care of the patient were reviewed by me and considered in my medical decision making (see chart for details).    MDM Rules/Calculators/A&P                          Patient with left-sided flank pain.  CT scan shows obstructive distal ureteral stone on the left that explains the patient's symptoms.  No sign  of urinary tract infection.  Final Clinical Impression(s) / ED Diagnoses Final diagnoses:  Kidney stone    Rx / DC Orders ED Discharge Orders         Ordered    tamsulosin (FLOMAX) 0.4 MG CAPS capsule  Daily        07/12/20 0240    oxyCODONE-acetaminophen (PERCOCET) 5-325 MG tablet  Every 4 hours PRN        07/12/20 0240           Orpah Greek, MD 07/12/20 [phone]

## 2020-07-12 NOTE — ED Triage Notes (Signed)
Pt c/o abd pain, L flank pain,  Intermittent with 2 episodes of emesis, onset this evening. 9/10 at its worst, 2/10 now with no intervention.

## 2020-07-12 NOTE — Telephone Encounter (Signed)
ED RNCM received call from [facility] with prescription clarification. Clarification provided no further questions or concerns noted.

## 2020-08-19 ENCOUNTER — Other Ambulatory Visit: Payer: Self-pay | Admitting: Orthopaedic Surgery

## 2020-08-19 DIAGNOSIS — M79671 Pain in right foot: Secondary | ICD-10-CM

## 2020-09-01 ENCOUNTER — Other Ambulatory Visit: Payer: Self-pay

## 2020-09-01 MED ORDER — METOPROLOL TARTRATE 25 MG PO TABS: 25.0000 mg | ORAL_TABLET | Freq: Two times a day (BID) | ORAL | 1 refills | Status: DC

## 2020-09-01 NOTE — Telephone Encounter (Signed)
Pt's medication was sent to pt's pharmacy as requested. Confirmation received.  °

## 2020-09-22 DIAGNOSIS — I10 Essential (primary) hypertension: Secondary | ICD-10-CM

## 2020-09-23 MED ORDER — LOSARTAN POTASSIUM-HCTZ 100-25 MG PO TABS: 1.0000 | ORAL_TABLET | Freq: Every day | ORAL | 3 refills | Status: DC

## 2020-09-23 NOTE — Telephone Encounter (Signed)
Called pt and reviewed medication changes with pt. He will monitor his BP daily to be sure it is trending down. Will be in for a BMP on June 7.

## 2020-10-13 ENCOUNTER — Other Ambulatory Visit: Payer: Self-pay

## 2020-10-13 ENCOUNTER — Other Ambulatory Visit: Payer: 59

## 2020-10-13 DIAGNOSIS — I10 Essential (primary) hypertension: Secondary | ICD-10-CM

## 2020-10-13 LAB — BASIC METABOLIC PANEL
BUN/Creatinine Ratio: 16 (ref 9–20)
BUN: 16 mg/dL (ref 6–24)
CO2: 22 mmol/L (ref 20–29)
Calcium: 9.5 mg/dL (ref 8.7–10.2)
Chloride: 100 mmol/L (ref 96–106)
Creatinine, Ser: 1.01 mg/dL (ref 0.76–1.27)
Glucose: 102 mg/dL — ABNORMAL HIGH (ref 65–99)
Potassium: 4.1 mmol/L (ref 3.5–5.2)
Sodium: 138 mmol/L (ref 134–144)
eGFR: 88 mL/min/{1.73_m2} (ref >59)

## 2020-11-14 NOTE — Progress Notes (Signed)
Date:  11/23/2020   ID:  Dominic Davies, DOB 11-Jan-1966, MRN [id number]   PCP:  Patient, No Pcp Per (Inactive)  Cardiologist:  Johnsie Cancel Electrophysiologist:  None   Chief Complaint:  Palpitations   History of Present Illness:    Dominic Davies is a 55 y.o. male first seen 07/05/18 for HTN and palpitations Had PAF in 07-29-2013 quiescent for years. CHADVASC 1 normal echo 07/30/14 Rx with PRN inderal Palpitations more likely PVCls now and improved with beta blocker Event monitor 07/11/18 benign. Seeing Dr Radford Pax now for OSA/CPAP   He does software sales related to [employer] No ETOH and limited caffeine  Echo reviewed 10/16/18 EF 60-65% no valve disease ECG 07/05/18 normal SB rate 59   Had cough and dyspnea February 2021 seen in ED and had multiple negative COVID tests. CT with RLL multifocal pneumonia Rx with azithromycin and medrol dosepak and albuterol F/U CT 06/18/19 resolved   October 2021  Niece and sister have been killed in car accidents and father died of stroke  07-29-20 kidney stone left flank pain minimally obstructing 37mm Given oxycodone and flomax  Needs f/u with Dr Radford Pax for OSA     Prior CV studies:   The following studies were reviewed today:  07/11/18 Event monitor NSR PVC;s no significant arrhythmia  Past Medical History:  Diagnosis Date   Irregular heart beat    Past Surgical History:  Procedure Laterality Date   APPENDECTOMY  1997   CARDIOVERSION     INGUINAL HERNIA REPAIR Left 09/2011     Current Meds  Medication Sig   cholecalciferol (VITAMIN D3) 25 MCG (1000 UNIT) tablet Take 1,000 Units by mouth daily.   losartan-hydrochlorothiazide (HYZAAR) 100-25 MG tablet Take 1 tablet by mouth daily.   metoprolol tartrate (LOPRESSOR) 25 MG tablet Take 1 tablet (25 mg total) by mouth 2 (two) times daily.     Allergies:   Patient has no known allergies.   Social History   Tobacco Use   Smoking status: Never   Smokeless tobacco: Never  Vaping Use    Vaping Use: Never used  Substance Use Topics   Alcohol use: No    Alcohol/week: 0.0 standard drinks   Drug use: No     Family Hx: The patient's family history includes CAD in an other family member; Heart attack in an other family member; Heart disease in an other family member; Hypertension in an other family member; Parkinson's disease in his father; Stroke in an other family member.  ROS:   Please see the history of present illness.     All other systems reviewed and are negative.   Labs/Other Tests and Data Reviewed:    Recent Labs: 07/12/2020: ALT 19; Hemoglobin 14.7; Platelets 292 10/13/2020: BUN 16; Creatinine, Ser 1.01; Potassium 4.1; Sodium 138   Recent Lipid Panel No results found for: CHOL, TRIG, HDL, CHOLHDL, LDLCALC, LDLDIRECT  Wt Readings from Last 3 Encounters:  11/23/20 88.3 kg  02/27/20 88.6 kg  08/23/19 86.6 kg     Objective:    Vital Signs:  BP 128/62   Pulse (!) 59   Ht 6\' 1"  (1.854 m)   Wt 88.3 kg   SpO2 97%   BMI 25.67 kg/m    Affect appropriate Healthy:  appears stated age HEENT: normal Neck supple with no adenopathy JVP normal no bruits no thyromegaly Lungs clear with no wheezing and good diaphragmatic motion Heart:  S1/S2 no murmur, no rub, gallop or click  PMI normal Abdomen: benighn, BS positve, no tenderness, no AAA no bruit.  No HSM or HJR Distal pulses intact with no bruits No edema Neuro non-focal Skin warm and dry No muscular weakness   ECG:  SR rate 73 normal   ASSESSMENT & PLAN:    1. HTN:  Now on Hyzaar 100/25 and lopressor 25 bid f/u BMET ok improved  2. Palpitations: Distant history of PAF Monitor with PVC;s Lopressor added Improved 3. OSA:  Mildly abnormal sleep study now on CPAP f/u [place]  4. CAD:  reviewed CT 06/18/19 minimal coronary calcium noted in rCA  5. Pulmonary:  Presumed COVID 06/2019 despite negative tests with abnormal CT chest RLL multifocal pneumonia  F/U CT 03/04/20 resolution of infiltrates  6. Kidney  Stone:  March 2022 resolved no recurrence    Medication Adjustments/Labs and Tests Ordered: Current medicines are reviewed at length with the patient today.  Concerns regarding medicines are outlined above.  Tests Ordered:  None   Medication Changes: No orders of the defined types were placed in this encounter.   Disposition: F/U in a year  And Dr Radford Pax for Sleep  Refer to [place], Jenkins Rouge, MD  11/23/2020 2:37 PM    [name]

## 2020-11-23 ENCOUNTER — Ambulatory Visit: Payer: 59 | Admitting: Cardiovascular Disease

## 2020-11-23 ENCOUNTER — Encounter: Payer: Self-pay | Admitting: Cardiovascular Disease

## 2020-11-23 ENCOUNTER — Other Ambulatory Visit: Payer: Self-pay

## 2020-11-23 VITALS — BP 128/62 | HR 59 | Ht 73.0 in | Wt 194.6 lb

## 2020-11-23 DIAGNOSIS — I10 Essential (primary) hypertension: Secondary | ICD-10-CM | POA: Diagnosis not present

## 2020-11-23 DIAGNOSIS — R002 Palpitations: Secondary | ICD-10-CM

## 2020-11-23 DIAGNOSIS — I2510 Atherosclerotic heart disease of native coronary artery without angina pectoris: Secondary | ICD-10-CM

## 2020-11-23 DIAGNOSIS — G4733 Obstructive sleep apnea (adult) (pediatric): Secondary | ICD-10-CM

## 2020-11-23 NOTE — Patient Instructions (Addendum)
Medication Instructions:  *If you need a refill on your cardiac medications before your next appointment, please call your pharmacy*  Lab Work: If you have labs (blood work) drawn today and your tests are completely normal, you will receive your results only by: [place] (if you have MyChart) OR A paper copy in the mail If you have any lab test that is abnormal or we need to change your treatment, we will call you to review the results.   Testing/Procedures: None ordered today.   Follow-Up: At [facility], you and your health needs are our priority.  As part of our continuing mission to provide you with exceptional heart care, we have created designated Provider Care Teams.  These Care Teams include your primary Cardiologist (physician) and Advanced Practice Providers (APPs -  Physician Assistants and Nurse Practitioners) who all work together to provide you with the care you need, when you need it.  We recommend signing up for the patient portal called "MyChart".  Sign up information is provided on this After Visit Summary.  MyChart is used to connect with patients for Virtual Visits (Telemedicine).  Patients are able to view lab/test results, encounter notes, upcoming appointments, etc.  Non-urgent messages can be sent to your provider as well.   To learn more about what you can do with MyChart, go to [email or web address].    Your next appointment:   12 month(s)  The format for your next appointment:   In Person  Provider:   You may see Jenkins Rouge, MD or one of the following Advanced Practice Providers on your designated Care Team:   Cecilie Kicks, NP   Please make an appointment to follow up with Dr. Radford Pax

## 2021-03-03 ENCOUNTER — Encounter: Payer: Self-pay | Admitting: Cardiology

## 2021-03-03 ENCOUNTER — Ambulatory Visit: Payer: 59 | Admitting: Cardiology

## 2021-03-03 ENCOUNTER — Other Ambulatory Visit: Payer: Self-pay

## 2021-03-03 VITALS — BP 110/74 | HR 62 | Ht 73.0 in | Wt 193.0 lb

## 2021-03-03 DIAGNOSIS — G4733 Obstructive sleep apnea (adult) (pediatric): Secondary | ICD-10-CM

## 2021-03-03 DIAGNOSIS — I10 Essential (primary) hypertension: Secondary | ICD-10-CM | POA: Diagnosis not present

## 2021-03-03 NOTE — Progress Notes (Signed)
Date:  03/03/2021   ID:  Dominic Davies, DOB Sep 22, 1965, MRN [id number]  PCP:  Patient, No Pcp Per (Inactive)  Cardiologist:  Jenkins Rouge, MD  Sleep Medicine:  Fransico Him, MD Electrophysiologist:  None   Chief Complaint:  OSA  History of Present Illness:    Dominic Davies is a 55 y.o. male  with a hx of HTN, PAF who was found to have mild OSA with an AHI of 9.9/hr and O2 sats as low as 92%.  He underwent CPAP titration to 6cm H2O and is now here for followup.   He tells me that he has had some inner ear issues when he uses it.  After he would use it he would feel like his eardrum would start itching.  In the past month he started losing hearing and went to PCP and was found to have growth protruding into his ear drum as well as a lot of wax.  He also was having a lot of sinus issues and cough when he would use it.  He admits to having some allergies.He has taken some flonase which helps some. He tolerates the nasal pillow mask but wakes up with a dry mouth.  He does not use a chin strap currently.  He feels the pressure is adequate.  Since going on CPAP he feels rested in the am and has no significant daytime sleepiness when he has used the PAP the night before.   Prior CV studies:   The following studies were reviewed today: PAP compliance download  Past Medical History:  Diagnosis Date   Irregular heart beat    Past Surgical History:  Procedure Laterality Date   APPENDECTOMY  1997   CARDIOVERSION     INGUINAL HERNIA REPAIR Left 09/2011     Current Meds  Medication Sig   losartan-hydrochlorothiazide (HYZAAR) 100-25 MG tablet Take 1 tablet by mouth daily.   metoprolol tartrate (LOPRESSOR) 25 MG tablet Take 1 tablet (25 mg total) by mouth 2 (two) times daily.   Multiple Vitamin (MULTIVITAMIN) tablet Take 1 tablet by mouth daily.     Allergies:   Patient has no known allergies.   Social History   Tobacco Use   Smoking status: Never   Smokeless tobacco: Never  Vaping  Use   Vaping Use: Never used  Substance Use Topics   Alcohol use: No    Alcohol/week: 0.0 standard drinks   Drug use: No     Family Hx: The patient's family history includes CAD in an other family member; Heart attack in an other family member; Heart disease in an other family member; Hypertension in an other family member; Parkinson's disease in his father; Stroke in an other family member.  ROS:   Please see the history of present illness.     All other systems reviewed and are negative.   Labs/Other Tests and Data Reviewed:    Recent Labs: 07/12/2020: ALT 19; Hemoglobin 14.7; Platelets 292 10/13/2020: BUN 16; Creatinine, Ser 1.01; Potassium 4.1; Sodium 138   Recent Lipid Panel No results found for: CHOL, TRIG, HDL, CHOLHDL, LDLCALC, LDLDIRECT  Wt Readings from Last 3 Encounters:  03/03/21 193 lb (87.5 kg)  11/23/20 194 lb 9.6 oz (88.3 kg)  02/27/20 195 lb 6.4 oz (88.6 kg)     Objective:    Vital Signs:  BP 110/74   Pulse 62   Ht 6\' 1"  (1.854 m)   Wt 193 lb (87.5 kg)   SpO2 97%  BMI 25.46 kg/m   GEN: Well nourished, well developed in no acute distress HEENT: Normal NECK: No JVD; No carotid bruits LYMPHATICS: No lymphadenopathy CARDIAC:RRR, no murmurs, rubs, gallops RESPIRATORY:  Clear to auscultation without rales, wheezing or rhonchi  ABDOMEN: Soft, non-tender, non-distended MUSCULOSKELETAL:  No edema; No deformity  SKIN: Warm and dry NEUROLOGIC:  Alert and oriented x 3 PSYCHIATRIC:  Normal affect    ASSESSMENT & PLAN:    1.  OSA - The patient is tolerating PAP therapy well without any problems. The PAP download performed by his DME was personally reviewed and interpreted by me today and showed an AHI of 0.3/hr on 6 cm H2O with 67% compliance in using more than 4 hours nightly.  The patient has been using and benefiting from PAP use and will continue to benefit from therapy.  -I encouraged him to be more compliant with his device -he has been having problems  with a growth in his ear with ear wax and has itching and discomfort when he uses his PAP.  He has an appt coming up with ENT -He also has a lot of issues with sinuses that he struggles with and I asked him to discuss with ENT as he says that [facility] has not helped in the past. -I will add a chin strap to help with his dry mouth   2.  HTN -BP is adequately controlled on exam today. -Continue prescription drug management with lisinopril 20 mg daily and Lopressor 25 mg twice daily with as needed refills  -I have personally reviewed and interpreted outside labs performed by patient's PCP which showed serum creatinine 1.01 and potassium 4.1 on 10/13/2020  Medication Adjustments/Labs and Tests Ordered: Current medicines are reviewed at length with the patient today.  Concerns regarding medicines are outlined above.  Tests Ordered: No orders of the defined types were placed in this encounter.  Medication Changes: No orders of the defined types were placed in this encounter.   Disposition:  Follow up in 1 year(s)  Signed, Fransico Him, MD  03/03/2021 8:59 AM    [facility]

## 2021-03-03 NOTE — Patient Instructions (Addendum)
Medication Instructions:  Your physician recommends that you continue on your current medications as directed. Please refer to the Current Medication list given to you today.  *If you need a refill on your cardiac medications before your next appointment, please call your pharmacy*   Follow-Up: At [facility], you and your health needs are our priority.  As part of our continuing mission to provide you with exceptional heart care, we have created designated Provider Care Teams.  These Care Teams include your primary Cardiologist (physician) and Advanced Practice Providers (APPs -  Physician Assistants and Nurse Practitioners) who all work together to provide you with the care you need, when you need it.  Your next appointment:   1 year(s)  The format for your next appointment:   In Person  Provider:   Fransico Him, MD  Other Instructions

## 2021-03-03 NOTE — Addendum Note (Signed)
Addended by: Antonieta Iba on: 03/03/2021 09:30 AM   Modules accepted: Orders

## 2021-03-17 ENCOUNTER — Other Ambulatory Visit: Payer: Self-pay | Admitting: Cardiovascular Disease

## 2021-04-13 ENCOUNTER — Other Ambulatory Visit: Payer: Self-pay

## 2021-04-13 ENCOUNTER — Ambulatory Visit: Payer: 59 | Admitting: Family Medicine

## 2021-04-13 ENCOUNTER — Encounter: Payer: Self-pay | Admitting: Family Medicine

## 2021-04-13 VITALS — BP 134/76 | HR 75 | Temp 97.6°F | Ht 73.0 in | Wt 196.0 lb

## 2021-04-13 DIAGNOSIS — G629 Polyneuropathy, unspecified: Secondary | ICD-10-CM

## 2021-04-13 DIAGNOSIS — G4733 Obstructive sleep apnea (adult) (pediatric): Secondary | ICD-10-CM

## 2021-04-13 DIAGNOSIS — Z23 Encounter for immunization: Secondary | ICD-10-CM

## 2021-04-13 DIAGNOSIS — Z1159 Encounter for screening for other viral diseases: Secondary | ICD-10-CM

## 2021-04-13 DIAGNOSIS — Z1211 Encounter for screening for malignant neoplasm of colon: Secondary | ICD-10-CM

## 2021-04-13 DIAGNOSIS — I493 Ventricular premature depolarization: Secondary | ICD-10-CM | POA: Diagnosis not present

## 2021-04-13 DIAGNOSIS — I10 Essential (primary) hypertension: Secondary | ICD-10-CM | POA: Diagnosis not present

## 2021-04-13 DIAGNOSIS — Z114 Encounter for screening for human immunodeficiency virus [HIV]: Secondary | ICD-10-CM

## 2021-04-13 DIAGNOSIS — Z13220 Encounter for screening for lipoid disorders: Secondary | ICD-10-CM

## 2021-04-13 NOTE — Progress Notes (Signed)
[facility] [address] [place] [address] Dept: [phone] Dept Fax: [phone]  New Patient Office Visit  Subjective:    Patient ID: Dominic Davies, male    DOB: 11/09/1965, 55 y.o..   MRN: [id number]  Chief Complaint  Patient presents with   Establish Care    NP- establish care.  C/o having pain/numbness in both feet x 1 year.   No OTC meds taken.      History of Present Illness:  Patient is in today to establish care. Mr. Livermore was born in [place], [place]. His father was a Company secretary, so the family moved around with time, includign [place], [place], and eventually [place]. Mr. Knierim attended [employer] with a Associates degree in electronics. 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 He has been married for 31 years. He has two children (28,24). He denies tobacco, alcohol,. or drug use.  Mr. Treinen has a history of hypertension. He is managed on Hyzaar. He notes this has been doing well to control his pressures and he denies side effects.  Mr. Go has a history of palpitations that are apparently due to frequent PVCs. He has had atrial fibrillation at least twice earlier in life. On the 2nd episode, he underwent cardioversion. He is currently managed with metoprolol for his palpitations.  Mr. Mckeehan has a history of sleep apnea. He is well managed with CPAP.  Mr. Monk notes a history of numbness of both feet for the past 2 years. He states that he occasionally has some burning pain. He has not prior history of diabetes and does not drink alcohol. There is no history of lumbar back pain.  Past Medical History: Patient Active Problem List   Diagnosis Date Noted   Obstructive sleep apnea 04/13/2021   Frequent PVCs 04/13/2021   Essential hypertension 05/11/2018   History of atrial fibrillation 04/13/2018   Palpitations 05/06/2014   Past Surgical History:  Procedure Laterality Date    APPENDECTOMY  05/10/1995   CARDIOVERSION     INGUINAL HERNIA REPAIR Left 09/07/2011   INGUINAL HERNIA REPAIR Right    Young child   Family History  Problem Relation Age of Onset   Cancer Mother        Breast   Heart disease Mother    Heart disease Father    Stroke Father    Parkinson's disease Father    Hypertension Sister    Hypertension Sister    Hypertension Brother    Heart attack Other    Heart disease Other    Stroke Other    Hypertension Other    CAD Other    Outpatient Medications Prior to Visit  Medication Sig Dispense Refill   cholecalciferol (VITAMIN D3) 25 MCG (1000 UNIT) tablet Take 1,000 Units by mouth daily.     losartan-hydrochlorothiazide (HYZAAR) 100-25 MG tablet Take 1 tablet by mouth daily. 90 tablet 3   metoprolol tartrate (LOPRESSOR) 25 MG tablet Take 1 tablet by mouth twice daily 180 tablet 2   Multiple Vitamin (MULTIVITAMIN) tablet Take 1 tablet by mouth daily.     oxyCODONE-acetaminophen (PERCOCET) 5-325 MG tablet Take 1-2 tablets by mouth every 4 (four) hours as needed. (Patient not taking: No sig reported) 15 tablet 0   tamsulosin (FLOMAX) 0.4 MG CAPS capsule Take 1 capsule (0.4 mg total) by mouth daily. (Patient not taking: No sig reported) 7 capsule 0   No facility-administered medications prior to visit.   No Known Allergies  Objective:   Today's Vitals   04/13/21 1310  BP: 134/76  Pulse: 75  Temp: 97.6 F (36.4 C)  TempSrc: Temporal  SpO2: 97%  Weight: 196 lb (88.9 kg)  Height: 6\' 1"  (1.854 m)   Body mass index is 25.86 kg/m.   General: Well developed, well nourished. No acute distress. Psych: Alert and oriented. Normal mood and affect.  Health Maintenance Due  Topic Date Due   COVID-19 Vaccine (1) Never done   Pneumococcal Vaccine 5-57 Years old (1 - PCV) Never done   Hepatitis C Screening  Never done   TETANUS/TDAP  Never done   COLONOSCOPY (Pts 45-33yrs Insurance coverage will need to be confirmed)  Never done   Zoster  Vaccines- Shingrix (1 of 2) Never done   INFLUENZA VACCINE  12/07/2020     Assessment & Plan:   1. Essential hypertension Blood pressure is at goal. Continue Hyzaar.  2. Frequent PVCs Appears to be adequately managed on metoprolol. Mr. Teichert will continue to follow with cardiology.  3. Obstructive sleep apnea Well managed on CPAP.  4. Peripheral polyneuropathy Review of prior labs shows no sign of diabetes. He has previously seen an orthopedist about this, without cause determined. I recommend referral to neurology to assess.  - Ambulatory referral to Neurology  5. Encounter for hepatitis C screening test for low risk patient  - HCV Ab w Reflex to Quant PCR; Future  6. Screening for HIV (human immunodeficiency virus)  - HIV Antibody (routine testing w rflx); Future  7. Screening for lipid disorders  - Lipid panel; Future  8. Screening for colon cancer  - Ambulatory referral to Gastroenterology  9. Need for Tdap vaccination  - Tdap vaccine greater than or equal to 7yo IM   Haydee Salter, MD

## 2021-04-14 ENCOUNTER — Encounter: Payer: Self-pay | Admitting: Family Medicine

## 2021-04-14 ENCOUNTER — Other Ambulatory Visit (INDEPENDENT_AMBULATORY_CARE_PROVIDER_SITE_OTHER): Payer: 59

## 2021-04-14 DIAGNOSIS — Z114 Encounter for screening for human immunodeficiency virus [HIV]: Secondary | ICD-10-CM | POA: Diagnosis not present

## 2021-04-14 DIAGNOSIS — Z13220 Encounter for screening for lipoid disorders: Secondary | ICD-10-CM | POA: Diagnosis not present

## 2021-04-14 DIAGNOSIS — Z1159 Encounter for screening for other viral diseases: Secondary | ICD-10-CM

## 2021-04-14 DIAGNOSIS — E785 Hyperlipidemia, unspecified: Secondary | ICD-10-CM | POA: Insufficient documentation

## 2021-04-14 LAB — LIPID PANEL
Cholesterol: 201 mg/dL — ABNORMAL HIGH (ref 0–200)
HDL: 37.9 mg/dL — ABNORMAL LOW (ref >39.00)
NonHDL: 163.24
Total CHOL/HDL Ratio: 5
Triglycerides: 310 mg/dL — ABNORMAL HIGH (ref 0.0–149.0)
VLDL: 62 mg/dL — ABNORMAL HIGH (ref 0.0–40.0)

## 2021-04-14 LAB — LDL CHOLESTEROL, DIRECT: Direct LDL: 133 mg/dL

## 2021-04-15 LAB — HCV AB W REFLEX TO QUANT PCR: HCV Ab: <0.1 s/co ratio (ref 0.0–0.9)

## 2021-04-15 LAB — HIV ANTIBODY (ROUTINE TESTING W REFLEX): HIV 1&2 Ab, 4th Generation: NONREACTIVE

## 2021-04-15 LAB — HCV INTERPRETATION

## 2021-06-24 ENCOUNTER — Ambulatory Visit: Payer: 59 | Admitting: Neurology

## 2021-06-24 ENCOUNTER — Encounter: Payer: Self-pay | Admitting: Neurology

## 2021-06-24 VITALS — BP 125/77 | HR 59 | Ht 73.0 in | Wt 196.8 lb

## 2021-06-24 DIAGNOSIS — G629 Polyneuropathy, unspecified: Secondary | ICD-10-CM

## 2021-06-24 NOTE — Patient Instructions (Addendum)
Extensive blood work today Can consider genetic testing in particular familial amyloidosis (can google amyloidosis and peripheral neuropathy) MRI lumber spine to be considered Emg/ncs to be considered Skin biopsy for small-fiber neuropathy Peripheral Neuropathy Peripheral neuropathy is a type of nerve damage. It affects nerves that carry signals between the spinal cord and the arms, legs, and the rest of the body (peripheral nerves). It does not affect nerves in the spinal cord or brain. In peripheral neuropathy, one nerve or a group of nerves may be damaged. Peripheral neuropathy is a broad category that includes many specific nerve disorders, like diabetic neuropathy, hereditary neuropathy, and carpal tunnel syndrome. What are the causes? This condition may be caused by: Diabetes. This is the most common cause of peripheral neuropathy. Nerve injury. Pressure or stress on a nerve that lasts a long time. Lack (deficiency) of B vitamins. This can result from alcoholism, poor diet, or a restricted diet. Infections. Autoimmune diseases, such as rheumatoid arthritis and systemic lupus erythematosus. Nerve diseases that are passed from parent to child (inherited). Some medicines, such as cancer medicines (chemotherapy). Poisonous (toxic) substances, such as lead and mercury. Too little blood flowing to the legs. Kidney disease. Thyroid disease. Obesity Alcohol(toxin) In some cases, the cause of this condition is not known. What are the signs or symptoms? Symptoms of this condition depend on which of your nerves is damaged. Common symptoms include: Loss of feeling (numbness) in the feet, hands, or both. Tingling in the feet, hands, or both. Burning pain. Very sensitive skin. Weakness. Not being able to move a part of the body (paralysis). Muscle twitching. Clumsiness or poor coordination. Loss of balance. Not being able to control your bladder. Feeling dizzy. Sexual problems. How is  this diagnosed? Diagnosing and finding the cause of peripheral neuropathy can be difficult. Your health care provider will take your medical history and do a physical exam. A neurological exam will also be done. This involves checking things that are affected by your brain, spinal cord, and nerves (nervous system). For example, your health care provider will check your reflexes, how you move, and what you can feel. You may have other tests, such as: Blood tests. Electromyogram (EMG) and nerve conduction tests. These tests check nerve function and how well the nerves are controlling the muscles. Imaging tests, such as CT scans or MRI to rule out other causes of your symptoms. Removing a small piece of nerve to be examined in a lab (nerve biopsy). Removing and examining a small amount of the fluid that surrounds the brain and spinal cord (lumbar puncture). How is this treated? Treatment for this condition may involve: Treating the underlying cause of the neuropathy, such as diabetes, kidney disease, or vitamin deficiencies. Stopping medicines that can cause neuropathy, such as chemotherapy. Medicine to help relieve pain. Medicines may include: Prescription or over-the-counter pain medicine. Antiseizure medicine. Antidepressants. Pain-relieving patches that are applied to painful areas of skin. Surgery to relieve pressure on a nerve or to destroy a nerve that is causing pain. Physical therapy to help improve movement and balance. Devices to help you move around (assistive devices). Follow these instructions at home: Medicines Take over-the-counter and prescription medicines only as told by your health care provider. Do not take any other medicines without first asking your health care provider. Do not drive or use heavy machinery while taking prescription pain medicine. Lifestyle  Do not use any products that contain nicotine or tobacco, such as cigarettes and e-cigarettes. Smoking keeps blood  from  reaching damaged nerves. If you need help quitting, ask your health care provider. Avoid or limit alcohol. Too much alcohol can cause a vitamin B deficiency, and vitamin B is needed for healthy nerves. Eat a healthy diet. This includes: Eating foods that are high in fiber, such as fresh fruits and vegetables, whole grains, and beans. Limiting foods that are high in fat and processed sugars, such as fried or sweet foods. General instructions  If you have diabetes, work closely with your health care provider to keep your blood sugar under control. If you have numbness in your feet: Check every day for signs of injury or infection. Watch for redness, warmth, and swelling. Wear padded socks and comfortable shoes. These help protect your feet. Develop a good support system. Living with peripheral neuropathy can be stressful. Consider talking with a mental health specialist or joining a support group. Use assistive devices and attend physical therapy as told by your health care provider. This may include using a walker or a cane. Keep all follow-up visits as told by your health care provider. This is important. Contact a health care provider if: You have new signs or symptoms of peripheral neuropathy. You are struggling emotionally from dealing with peripheral neuropathy. Your pain is not well-controlled. Get help right away if: You have an injury or infection that is not healing normally. You develop new weakness in an arm or leg. You have fallen or do so frequently. Summary Peripheral neuropathy is when the nerves in the arms, or legs are damaged, resulting in numbness, weakness, or pain. There are many causes of peripheral neuropathy, including diabetes, pinched nerves, vitamin deficiencies, autoimmune disease, and hereditary conditions. Diagnosing and finding the cause of peripheral neuropathy can be difficult. Your health care provider will take your medical history, do a physical exam,  and do tests, including blood tests and nerve function tests. Treatment involves treating the underlying cause of the neuropathy and taking medicines to help control pain. Physical therapy and assistive devices may also help. This information is not intended to replace advice given to you by your health care provider. Make sure you discuss any questions you have with your health care provider. Document Revised: 02/04/2020 Document Reviewed: 02/04/2020 Elsevier Patient Education  2022 [place] is a test to check how well your muscles and nerves are working. This procedure includes the combined use of electromyogram (EMG) and nerve conduction study (NCS). EMG is used to evaluate muscles and the nerves that control those muscles. NCS, which is also called electroneurogram, measures how well your nerves conduct electricity. The procedures should be done together to check if your muscles and nerves are healthy. If the results of the tests are abnormal, this may indicate disease or injury, such as a neuromuscular disease or peripheral nerve damage. Tell a health care provider about: Any allergies you have. All medicines you are taking, including vitamins, herbs, eye drops, creams, and over-the-counter medicines. Any bleeding problems you have. Any surgeries you have had. Any medical conditions you have. What are the risks? Generally, this is a safe procedure. However, problems may occur, including: Bleeding or bruising. Infection where the electrodes were inserted. What happens before the test? Medicines Take all of your usually prescribed medications before this testing is performed. Do not stop your blood thinners unless advised by your prescribing physician. General instructions Your health care provider may ask you to warm the limb that will be checked with warm water, hot pack, or wrapping the  limb in a blanket. Do not use lotions or creams on the  same day that you will be having the procedure. What happens during the test? For EMG  Your health care provider will ask you to stay in a position so that the muscle being studied can be accessed. You will be sitting or lying down. You may be given a medicine to numb the area (local anesthetic) and the skin will be disinfected. A very thin needle that has an electrode will be inserted into your muscle, one muscle at a time. Typically, multiple muscles are evaluated during a single study. Another small electrode will be placed on your skin near the muscle. Your health care provider will ask you to continue to remain still. The electrodes will record the electrical activity of your muscles. You may see this on a monitor or hear it in the room. After your muscles have been studied at rest, your health care provider will ask you to contract or flex your muscles. The electrodes will record the electrical activity of your muscles. Your health care provider will remove the electrodes and the electrode needle when the procedure is finished. The procedure may vary among health care providers and hospitals. For NCS  An electrode that records your nerve activity (recording electrode) will be placed on your skin by the muscle that is being studied. An electrode that is used as a reference (reference electrode) will be placed near the recording electrode. A paste or gel will be applied to your skin between the recording electrode and the reference electrode. Your nerve will be stimulated with a mild shock. The speed of the nerves and strength of response is recorded by the electrodes. Your health care provider will remove the electrodes and the gel when the procedure is finished. The procedure may vary among health care providers and hospitals. What can I expect after the test? It is up to you to get your test results. Ask your health care provider, or the department that is doing the test, when your  results will be ready. Your health care provider may: Give you medicines for any pain. Monitor the insertion sites to make sure that bleeding stops. You should be able to drive yourself to and from the test. Discomfort can persist for a few hours after the test, but should be better the next day. Contact a health care provider if: You have swelling, redness, or drainage at any of the insertion sites. Summary Electromyoneurogram is a test to check how well your muscles and nerves are working. If the results of the tests are abnormal, this may indicate disease or injury. This is a safe procedure. However, problems may occur, such as bleeding and infection. Your health care provider will do two tests to complete this procedure. One checks your muscles (EMG) and another checks your nerves (NCS). It is up to you to get your test results. Ask your health care provider, or the department that is doing the test, when your results will be ready. This information is not intended to replace advice given to you by your health care provider. Make sure you discuss any questions you have with your health care provider. Document Revised: 01/06/2021 Document Reviewed: 12/06/2020 Elsevier Patient Education  [place].
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
## 2021-06-27 DIAGNOSIS — G629 Polyneuropathy, unspecified: Secondary | ICD-10-CM | POA: Insufficient documentation

## 2021-06-30 LAB — HEAVY METALS, BLOOD
Arsenic: 1 ug/L (ref 0–9)
Lead, Blood: <1 ug/dL (ref 0.0–3.4)
Mercury: <1 ug/L (ref 0.0–14.9)

## 2021-06-30 LAB — RHEUMATOID FACTOR: Rheumatoid fact SerPl-aCnc: <10 IU/mL (ref <14.0)

## 2021-06-30 LAB — MULTIPLE MYELOMA PANEL, SERUM
Albumin SerPl Elph-Mcnc: 4 g/dL (ref 2.9–4.4)
Albumin/Glob SerPl: 1.3 (ref 0.7–1.7)
Alpha 1: 0.2 g/dL (ref 0.0–0.4)
Alpha2 Glob SerPl Elph-Mcnc: 0.8 g/dL (ref 0.4–1.0)
B-Globulin SerPl Elph-Mcnc: 1.1 g/dL (ref 0.7–1.3)
Gamma Glob SerPl Elph-Mcnc: 1.2 g/dL (ref 0.4–1.8)
Globulin, Total: 3.3 g/dL (ref 2.2–3.9)
IgA/Immunoglobulin A, Serum: 211 mg/dL (ref 90–386)
IgG (Immunoglobin G), Serum: 1245 mg/dL (ref 603–1613)
IgM (Immunoglobulin M), Srm: 160 mg/dL (ref 20–172)
Total Protein: 7.3 g/dL (ref 6.0–8.5)

## 2021-06-30 LAB — ANCA PROFILE (RDL)
ANCA by IFA (RDL): NEGATIVE
Anti-MPO Ab (RDL): <20 Units (ref <20)
Anti-PR-3 Ab (RDL): <20 Units (ref <20)

## 2021-06-30 LAB — TSH: TSH: 2.04 u[IU]/mL (ref 0.450–4.500)

## 2021-06-30 LAB — ANTINUCLEAR ANTIBODIES, IFA: ANA Titer 1: NEGATIVE

## 2021-06-30 LAB — VITAMIN B1: Thiamine: 184.7 nmol/L (ref 66.5–200.0)

## 2021-06-30 LAB — HEMOGLOBIN A1C
Est. average glucose Bld gHb Est-mCnc: 108 mg/dL
Hgb A1c MFr Bld: 5.4 % (ref 4.8–5.6)

## 2021-06-30 LAB — VITAMIN B6: Vitamin B6: 122.8 ug/L — ABNORMAL HIGH (ref 3.4–65.2)

## 2021-06-30 LAB — B12 AND FOLATE PANEL
Folate: 10.8 ng/mL (ref >3.0)
Vitamin B-12: 434 pg/mL (ref 232–1245)

## 2021-06-30 LAB — SJOGREN'S SYNDROME ANTIBODS(SSA + SSB)
ENA SSA (RO) Ab: 0.2 AI (ref 0.0–0.9)
ENA SSB (LA) Ab: <0.2 AI (ref 0.0–0.9)

## 2021-06-30 LAB — METHYLMALONIC ACID, SERUM: Methylmalonic Acid: 212 nmol/L (ref 0–378)

## 2021-06-30 LAB — ANGIOTENSIN CONVERTING ENZYME: Angio Convert Enzyme: 35 U/L (ref 14–82)

## 2021-07-12 ENCOUNTER — Other Ambulatory Visit: Payer: Self-pay

## 2021-07-13 ENCOUNTER — Ambulatory Visit: Payer: 59 | Admitting: Family Medicine

## 2021-07-13 DIAGNOSIS — G629 Polyneuropathy, unspecified: Secondary | ICD-10-CM | POA: Diagnosis not present

## 2021-07-13 DIAGNOSIS — I10 Essential (primary) hypertension: Secondary | ICD-10-CM | POA: Diagnosis not present

## 2021-07-13 DIAGNOSIS — R42 Dizziness and giddiness: Secondary | ICD-10-CM | POA: Diagnosis not present

## 2021-07-13 DIAGNOSIS — I493 Ventricular premature depolarization: Secondary | ICD-10-CM

## 2021-07-13 NOTE — Progress Notes (Signed)
?[facility] ?[facility] ?[place] ?[place] [address] ?Dept: [phone] ?Dept Fax: [phone] ? ?Chronic Care Office Visit ? ?Subjective:  ? ? Patient ID: Dominic Davies, male    DOB: [birth year], 56 y.o..   MRN: [id number] ? ?Chief Complaint  ?Patient presents with  ? Follow-up  ?  3 month f/u HTN.  BP 135/90 at home.  C/o having Vertigo in the mornings x 2 weeks and when gets up too fast.   ? ? ?History of Present Illness: ? ?Patient is in today for reassessment of chronic medical issues. ? ?Mr. Dominic Davies has a history of hypertension. He is managed on Hyzaar. He notes this has been doing well to control his pressures and he denies side effects. ?  ?Mr. Dominic Davies has a history of palpitations that are apparently due to frequent PVCs. He is currently managed with metoprolol for his palpitations. He notes he feels the PVCs infrequently and does feel fatigue when these seem to pair. He feels he is tolerating this. ?  ?Mr. Dominic Davies notes a history of  a peripheral neuropathy. He recently saw Dr. Jaynee Davies (neurology) for evaluation of this. She did a battery of tests which were normal. He will be following up with her within 6 months. ? ?Mr. Dominic Davies notes an recent issue with vertigo. He states this first occurred 2 weeks ago. He was rolling from his right to his left side in bed, when he had sudden onset of a sensation of the room spinning. He notes this recurred at night for about a week. The only other time this occurred was during neck rotation at his chiropractor's office. This seems ot be improved now. he has had no hearing loss of tinnitus. ? ?Past Medical History: ?Patient Active Problem List  ? Diagnosis Date Noted  ? Polyneuropathy 06/27/2021  ? Hyperlipidemia 04/14/2021  ? Obstructive sleep apnea 04/13/2021  ? Frequent PVCs 04/13/2021  ? Essential hypertension 05/11/2018  ? History of atrial fibrillation 04/13/2018  ? Palpitations 05/06/2014  ? ?Past Surgical History:   ?Procedure Laterality Date  ? APPENDECTOMY  05/10/1995  ? CARDIOVERSION    ? INGUINAL HERNIA REPAIR Left 09/07/2011  ? INGUINAL HERNIA REPAIR Right   ? Young child  ? ?Family History  ?Problem Relation Age of Onset  ? Cancer Mother   ?     Breast  ? Heart disease Mother   ? Heart disease Father   ? Stroke Father   ? Parkinson's disease Father   ? Neuropathy Father   ? Hypertension Sister   ? Hypertension Sister   ? Hypertension Brother   ? Heart attack Other   ? Heart disease Other   ? Stroke Other   ? Hypertension Other   ? CAD Other   ? ?Outpatient Medications Prior to Visit  ?Medication Sig Dispense Refill  ? cholecalciferol (VITAMIN D3) 25 MCG (1000 UNIT) tablet Take 1,000 Units by mouth daily.    ? losartan-hydrochlorothiazide (HYZAAR) 100-25 MG tablet Take 1 tablet by mouth daily. 90 tablet 3  ? metoprolol tartrate (LOPRESSOR) 25 MG tablet Take 1 tablet by mouth twice daily 180 tablet 2  ? Multiple Vitamin (MULTIVITAMIN) tablet Take 1 tablet by mouth daily.    ? ?No facility-administered medications prior to visit.  ? ?No Known Allergies ?   ?Objective:  ? ?There were no vitals filed for this visit. ?There is no height or weight on file to calculate BMI.  ? ?General: Well developed, well nourished.  No acute distress. ?HEENT: Normocephalic, non-traumatic. External ears normal. EAC and TMs normal  ? bilaterally. PERRL, EOMI. Conjunctiva clear. No nystagmus noted with rotation of the head ? while prone. ?Psych: Alert and oriented. Normal mood and affect. ? ?Health Maintenance Due  ?Topic Date Due  ? COLONOSCOPY (Pts 45-29yr Insurance coverage will need to be confirmed)  Never done  ?   ?Assessment & Plan:  ? ?1. Essential hypertension ?Blood pressure at goal. Continue Hyzaar and metoprolol. ? ?2. Polyneuropathy ?Reviewed consult note from Dr. AJaynee Eaglesand the extensive lab results. All of these were essentially normal. He will follow-up with Dr. AJaynee Eaglesas scheduled. ? ?3. Frequent PVCs ?Stable. Continue metoprolol  25 mg bid. ? ?4. Vertigo ?This is likely BPPV. We discussed the etiology of this. I talked him through Epley maneuvers should this recur. ? ?Return in about 4 months (around 11/12/2021).  ? ?Dominic Salter MD ?

## 2021-07-13 NOTE — Patient Instructions (Signed)
Benign Positional Vertigo ?Vertigo is the feeling that you or your surroundings are moving when they are not. Benign positional vertigo is the most common form of vertigo. This is usually a harmless condition (benign). This condition is positional. This means that symptoms are triggered by certain movements and positions. ?This condition can be dangerous if it occurs while you are doing something that could cause harm to yourself or others. This includes activities such as driving or operating machinery. ?What are the causes? ?The inner ear has fluid-filled canals that help your brain sense movement and balance. When the fluid moves, the brain receives messages about your body's position. ?With benign positional vertigo, calcium crystals in the inner ear break free and disturb the inner ear area. This causes your brain to receive confusing messages about your body's position. ?What increases the risk? ?You are more likely to develop this condition if: ?You are a woman. ?You are 59 years of age or older. ?You have recently had a head injury. ?You have an inner ear disease. ?What are the signs or symptoms? ?Symptoms of this condition usually happen when you move your head or your eyes in different directions. Symptoms may start suddenly and usually last for less than a minute. They include: ?Loss of balance and falling. ?Feeling like you are spinning or moving. ?Feeling like your surroundings are spinning or moving. ?Nausea and vomiting. ?Blurred vision. ?Dizziness. ?Involuntary eye movement (nystagmus). ?Symptoms can be mild and cause only minor problems, or they can be severe and interfere with daily life. Episodes of benign positional vertigo may return (recur) over time. Symptoms may also improve over time. ?How is this diagnosed? ?This condition may be diagnosed based on: ?Your medical history. ?A physical exam of the head, neck, and ears. ?Positional tests to check for or stimulate vertigo. You may be asked to  turn your head and change positions, such as going from sitting to lying down. A health care provider will watch for symptoms of vertigo. ?You may be referred to a health care provider who specializes in ear, nose, and throat problems (ENT or otolaryngologist) or a provider who specializes in disorders of the nervous system (neurologist). ?How is this treated? ?This condition may be treated in a session in which your health care provider moves your head in specific positions to help the displaced crystals in your inner ear move. Treatment for this condition may take several sessions. Surgery may be needed in severe cases, but this is rare. ?In some cases, benign positional vertigo may resolve on its own in 2-4 weeks. ?Follow these instructions at home: ?Safety ?Move slowly. Avoid sudden body or head movements or certain positions, as told by your health care provider. ?Avoid driving or operating machinery until your health care provider says it is safe. ?Avoid doing any tasks that would be dangerous to you or others if vertigo occurs. ?If you have trouble walking or keeping your balance, try using a cane for stability. If you feel dizzy or unstable, sit down right away. ?Return to your normal activities as told by your health care provider. Ask your health care provider what activities are safe for you. ?General instructions ?Take over-the-counter and prescription medicines only as told by your health care provider. ?Drink enough fluid to keep your urine pale yellow. ?Keep all follow-up visits. This is important. ?Contact a health care provider if: ?You have a fever. ?Your condition gets worse or you develop new symptoms. ?Your family or friends notice any behavioral changes. ?You  have nausea or vomiting that gets worse. ?You have numbness or a prickling and tingling sensation. ?Get help right away if you: ?Have difficulty speaking or moving. ?Are always dizzy or faint. ?Develop severe headaches. ?Have weakness in  your legs or arms. ?Have changes in your hearing or vision. ?Develop a stiff neck. ?Develop sensitivity to light. ?These symptoms may represent a serious problem that is an emergency. Do not wait to see if the symptoms will go away. Get medical help right away. Call your local emergency services (911 in the [place] Do not drive yourself to the hospital. ?Summary ?Vertigo is the feeling that you or your surroundings are moving when they are not. Benign positional vertigo is the most common form of vertigo. ?This condition is caused by calcium crystals in the inner ear that become displaced. This causes a disturbance in an area of the inner ear that helps your brain sense movement and balance. ?Symptoms include loss of balance and falling, feeling that you or your surroundings are moving, nausea and vomiting, and blurred vision. ?This condition can be diagnosed based on symptoms, a physical exam, and positional tests. ?Follow safety instructions as told by your health care provider and keep all follow-up visits. This is important. ?This information is not intended to replace advice given to you by your health care provider. Make sure you discuss any questions you have with your health care provider. ?Document Revised: 03/25/2020 Document Reviewed: 03/25/2020 ?Elsevier Patient Education ? [place]. ? ?

## 2021-08-12 ENCOUNTER — Encounter: Payer: Self-pay | Admitting: Family Medicine

## 2021-10-19 ENCOUNTER — Other Ambulatory Visit: Payer: Self-pay | Admitting: Cardiovascular Disease

## 2021-10-19 DIAGNOSIS — I10 Essential (primary) hypertension: Secondary | ICD-10-CM

## 2021-10-25 ENCOUNTER — Other Ambulatory Visit: Payer: Self-pay | Admitting: *Deleted

## 2021-10-25 DIAGNOSIS — I10 Essential (primary) hypertension: Secondary | ICD-10-CM

## 2021-10-25 MED ORDER — LOSARTAN POTASSIUM-HCTZ 100-25 MG PO TABS: 1.0000 | ORAL_TABLET | Freq: Every day | ORAL | 2 refills | Status: DC

## 2021-10-27 ENCOUNTER — Ambulatory Visit (AMBULATORY_SURGERY_CENTER): Payer: Self-pay | Admitting: *Deleted

## 2021-10-27 VITALS — Ht 73.0 in | Wt 196.0 lb

## 2021-10-27 DIAGNOSIS — Z1211 Encounter for screening for malignant neoplasm of colon: Secondary | ICD-10-CM

## 2021-10-27 MED ORDER — NA SULFATE-K SULFATE-MG SULF 17.5-3.13-1.6 GM/177ML PO SOLN: 1.0000 | Freq: Once | ORAL | 0 refills | Status: AC

## 2021-10-27 NOTE — Progress Notes (Signed)
No egg or soy allergy known to patient  No issues known to pt with past sedation with any surgeries or procedures Patient denies ever being told they had issues or difficulty with intubation  No FH of Malignant Hyperthermia Pt is not on diet pills Pt is not on  home 02  Pt is not on blood thinners  Pt denies issues with constipation  Past hx  A fib ~20 yrs ago 2002 last episode     NO PA's for preps discussed with pt In PV today  Discussed with pt there will be an out-of-pocket cost for prep and that varies from $0 to 70 +  dollars - pt verbalized understanding  Pt instructed to use Singlecare.com or GoodRx for a price reduction on prep

## 2021-11-17 ENCOUNTER — Ambulatory Visit: Payer: 59 | Admitting: Family Medicine

## 2021-11-17 ENCOUNTER — Encounter: Payer: Self-pay | Admitting: Family Medicine

## 2021-11-17 VITALS — BP 114/72 | HR 66 | Temp 97.3°F | Ht 73.0 in | Wt 195.2 lb

## 2021-11-17 DIAGNOSIS — R0981 Nasal congestion: Secondary | ICD-10-CM | POA: Diagnosis not present

## 2021-11-17 DIAGNOSIS — I493 Ventricular premature depolarization: Secondary | ICD-10-CM | POA: Diagnosis not present

## 2021-11-17 DIAGNOSIS — E876 Hypokalemia: Secondary | ICD-10-CM | POA: Insufficient documentation

## 2021-11-17 DIAGNOSIS — I10 Essential (primary) hypertension: Secondary | ICD-10-CM | POA: Diagnosis not present

## 2021-11-17 LAB — BASIC METABOLIC PANEL
BUN: 21 mg/dL (ref 6–23)
CO2: 25 mEq/L (ref 19–32)
Calcium: 9.4 mg/dL (ref 8.4–10.5)
Chloride: 99 mEq/L (ref 96–112)
Creatinine, Ser: 1.09 mg/dL (ref 0.40–1.50)
GFR: 75.81 mL/min (ref >60.00)
Glucose, Bld: 143 mg/dL — ABNORMAL HIGH (ref 70–99)
Potassium: 3.1 mEq/L — ABNORMAL LOW (ref 3.5–5.1)
Sodium: 134 mEq/L — ABNORMAL LOW (ref 135–145)

## 2021-11-17 MED ORDER — POTASSIUM CHLORIDE CRYS ER 20 MEQ PO TBCR: 20.0000 meq | EXTENDED_RELEASE_TABLET | Freq: Every day | ORAL | 3 refills | Status: DC

## 2021-11-17 MED ORDER — LOSARTAN POTASSIUM-HCTZ 100-25 MG PO TABS: 1.0000 | ORAL_TABLET | Freq: Every day | ORAL | 3 refills | Status: DC

## 2021-11-17 NOTE — Addendum Note (Signed)
Addended by: Haydee Salter on: 11/17/2021 01:38 PM   Modules accepted: Orders

## 2021-11-17 NOTE — Progress Notes (Signed)
[facility] [address] [place] [address] Dept: [phone] Dept Fax: [phone]  Chronic Care Office Visit  Subjective:    Patient ID: Dominic Davies, male    DOB: November 12, 1965, 56 y.o..   MRN: [id number]  Chief Complaint  Patient presents with   Follow-up    4 month f/u HTN/meds.  C/o having sinus  pressure.  Has tried OTC allergy meds with no relief.   Not  fasting.     History of Present Illness:  Patient is in today for reassessment of chronic medical issues.  Dominic Davies has a history of hypertension. He is managed on Hyzaar 100-25 mg daily.    Dominic Davies has a history of palpitations that are apparently due to frequent PVCs. He is currently managed with metoprolol 25 mg bid. He notes he feels the PVCs fairly frequently. He sometimes has short periods where they occur even every 3rd beat, but this resolves when he takes his medicine. He sometimes notes some mild dyspnea with exertion. He denies any lower leg edema, orthopnea or PND.  Despite this he is back to exercising using his rowing machine and doing some weight lifting. He has worked at [employer] loss over the past 6 months and feels good about his progress.  Dominic Davies notes he has been having issues with sinus congestion and thick mucous that he blows from the nose 5-6 times a day. He has used Flonase and Zyrtec at times for this and did find the Flonase helped. He is not using either currently. He feels pressure at times int he left maxillary sinus.  Past Medical History: Patient Active Problem List   Diagnosis Date Noted   Polyneuropathy 06/27/2021   Hyperlipidemia 04/14/2021   Obstructive sleep apnea 04/13/2021   Frequent PVCs 04/13/2021   Essential hypertension 05/11/2018   History of atrial fibrillation 04/13/2018   Palpitations 05/06/2014   Past Surgical History:  Procedure Laterality Date   APPENDECTOMY  05/10/1995   CARDIOVERSION     due to a fib    INGUINAL HERNIA REPAIR Left 09/07/2011   INGUINAL HERNIA REPAIR Right    Young child   Family History  Problem Relation Age of Onset   Cancer Mother        Breast   Heart disease Mother    Heart disease Father    Stroke Father    Parkinson's disease Father    Neuropathy Father    Colon polyps Sister    Hypertension Sister    Hypertension Sister    Hypertension Brother    Heart attack Other    Heart disease Other    Stroke Other    Hypertension Other    CAD Other    Colon cancer Neg Hx    Esophageal cancer Neg Hx    Rectal cancer Neg Hx    Stomach cancer Neg Hx    Outpatient Medications Prior to Visit  Medication Sig Dispense Refill   metoprolol tartrate (LOPRESSOR) 25 MG tablet Take 1 tablet by mouth twice daily 180 tablet 2   Multiple Vitamin (MULTIVITAMIN) tablet Take 1 tablet by mouth daily.     losartan-hydrochlorothiazide (HYZAAR) 100-25 MG tablet Take 1 tablet by mouth daily. Please call [phone] to schedule an appointment for future refills. Thank you. 30 tablet 2   cholecalciferol (VITAMIN D3) 25 MCG (1000 UNIT) tablet Take 1,000 Units by mouth daily. (Patient not taking: Reported on 10/27/2021)     No facility-administered medications prior  to visit.   No Known Allergies    Objective:   Today's Vitals   11/17/21 0921  BP: 114/72  Pulse: 66  Temp: (!) 97.3 F (36.3 C)  TempSrc: Temporal  SpO2: 93%  Weight: 195 lb 3.2 oz (88.5 kg)  Height: '6\' 1"'$  (1.854 m)   Body mass index is 25.75 kg/m.   General: Well developed, well nourished. No acute distress. HEENT: Normocephalic, non-traumatic. No pain on percussion over the sinuses. Lungs: Clear to auscultation bilaterally. No wheezing, rales or rhonchi. CV: RRR without murmurs or rubs. Rare skip beat noted. Pulses 2+ bilaterally. Extremities: No edema noted. Psych: Alert and oriented. Normal mood and affect.  Health Maintenance Due  Topic Date Due   COLONOSCOPY (Pts 45-54yr Insurance coverage will  need to be confirmed)  Never done   Zoster Vaccines- Shingrix (1 of 2) Never done     Assessment & Plan:   1. Essential hypertension Blood pressure is at goal. Due for annual check on kidney function. Continue Hyzaar 100-25 mg daily.  - Basic metabolic panel - losartan-hydrochlorothiazide (HYZAAR) 100-25 MG tablet; Take 1 tablet by mouth daily.  Dispense: 90 tablet; Refill: 3  2. Frequent PVCs These appear to be fairly well controlled with metoprolol 25 mg bid. The DOE could be a result of the beta blocker therapy, as he has no other signs of HF. I do recommend he discuss this further with his cardiologist at his next appointment.  3. Nasal sinus congestion I recommend he return to using Flonase +/- Zyrtec. He night [place] nasal saline washes.   Return in about 3 months (around 02/17/2022) for Reassessment.   SHaydee Salter MD

## 2021-11-24 ENCOUNTER — Encounter: Payer: 59 | Admitting: Gastroenterology

## 2021-11-25 ENCOUNTER — Encounter: Payer: Self-pay | Admitting: Gastroenterology

## 2021-11-28 ENCOUNTER — Encounter: Payer: Self-pay | Admitting: Certified Registered Nurse Anesthetist

## 2021-12-02 ENCOUNTER — Ambulatory Visit (AMBULATORY_SURGERY_CENTER): Payer: 59 | Admitting: Gastroenterology

## 2021-12-02 ENCOUNTER — Encounter: Payer: Self-pay | Admitting: Gastroenterology

## 2021-12-02 VITALS — BP 160/89 | HR 66 | Temp 96.8°F | Resp 11 | Ht 73.0 in | Wt 196.0 lb

## 2021-12-02 DIAGNOSIS — D123 Benign neoplasm of transverse colon: Secondary | ICD-10-CM | POA: Diagnosis not present

## 2021-12-02 DIAGNOSIS — D125 Benign neoplasm of sigmoid colon: Secondary | ICD-10-CM | POA: Diagnosis not present

## 2021-12-02 DIAGNOSIS — Z1211 Encounter for screening for malignant neoplasm of colon: Secondary | ICD-10-CM | POA: Diagnosis present

## 2021-12-02 DIAGNOSIS — D122 Benign neoplasm of ascending colon: Secondary | ICD-10-CM

## 2021-12-02 DIAGNOSIS — D120 Benign neoplasm of cecum: Secondary | ICD-10-CM

## 2021-12-02 MED ORDER — SODIUM CHLORIDE 0.9 % IV SOLN: 500.0000 mL | INTRAVENOUS | Status: DC

## 2021-12-02 NOTE — Op Note (Signed)
[facility] Patient Name: Dominic Davies Procedure Date: 12/02/2021 1:03 PM MRN: [id number] Endoscopist: Remo Lipps P. Havery Moros , MD Age: 56 Referring MD:  Date of Birth: [birth year] Gender: Male Account #: [id number] Procedure:                Colonoscopy Indications:              Screening for colorectal malignant neoplasm, This                            is the patient's first colonoscopy Medicines:                Monitored Anesthesia Care Procedure:                Pre-Anesthesia Assessment:                           - Prior to the procedure, a History and Physical                            was performed, and patient medications and                            allergies were reviewed. The patient's tolerance of                            previous anesthesia was also reviewed. The risks                            and benefits of the procedure and the sedation                            options and risks were discussed with the patient.                            All questions were answered, and informed consent                            was obtained. Prior Anticoagulants: The patient has                            taken no previous anticoagulant or antiplatelet                            agents. ASA Grade Assessment: II - A patient with                            mild systemic disease. After reviewing the risks                            and benefits, the patient was deemed in                            satisfactory condition to undergo the procedure.  After obtaining informed consent, the colonoscope                            was passed under direct vision. Throughout the                            procedure, the patient's blood pressure, pulse, and                            oxygen saturations were monitored continuously. The                            CF [id number] #[id number] was introduced through the anus                            and advanced to the  the cecum, identified by                            appendiceal orifice and ileocecal valve. The                            colonoscopy was performed without difficulty. The                            patient tolerated the procedure well. The quality                            of the bowel preparation was good. The ileocecal                            valve, appendiceal orifice, and rectum were                            photographed. Scope In: 1:08:09 PM Scope Out: 1:30:54 PM Scope Withdrawal Time: 0 hours 18 minutes 9 seconds  Total Procedure Duration: 0 hours 22 minutes 45 seconds  Findings:                 The perianal and digital rectal examinations were                            normal.                           A diminutive polyp was found in the cecum. The                            polyp was sessile. The polyp was removed with a                            cold snare. Resection and retrieval were complete.                           Six sessile polyps were found in the ascending  colon. The polyps were 3 to 4 mm in size. These                            polyps were removed with a cold snare. Resection                            and retrieval were complete.                           Two sessile polyps were found in the hepatic                            flexure. The polyps were 3 to 4 mm in size. These                            polyps were removed with a cold snare. Resection                            and retrieval were complete.                           Three sessile polyps were found in the sigmoid                            colon. The polyps were 3 to 4 mm in size. These                            polyps were removed with a cold snare. Resection                            and retrieval were complete.                           Internal hemorrhoids were found during retroflexion.                           The exam was otherwise without  abnormality. Complications:            No immediate complications. Estimated blood loss:                            Minimal. Estimated Blood Loss:     Estimated blood loss was minimal. Impression:               - One diminutive polyp in the cecum, removed with a                            cold snare. Resected and retrieved.                           - Six 3 to 4 mm polyps in the ascending colon,                            removed with a  cold snare. Resected and retrieved.                           - Two 3 to 4 mm polyps at the hepatic flexure,                            removed with a cold snare. Resected and retrieved.                           - Three 3 to 4 mm polyps in the sigmoid colon,                            removed with a cold snare. Resected and retrieved.                           - Internal hemorrhoids.                           - The examination was otherwise normal. Recommendation:           - Patient has a contact number available for                            emergencies. The signs and symptoms of potential                            delayed complications were discussed with the                            patient. Return to normal activities tomorrow.                            Written discharge instructions were provided to the                            patient.                           - Resume previous diet.                           - Continue present medications.                           - Await pathology results. Remo Lipps P. Asiyah Pineau, MD 12/02/2021 1:37:20 PM This report has been signed electronically.

## 2021-12-02 NOTE — Progress Notes (Signed)
Report given to PACU, vss 

## 2021-12-02 NOTE — Progress Notes (Signed)
Pt's states no medical or surgical changes since previsit or office visit. 

## 2021-12-02 NOTE — Patient Instructions (Signed)
HANDOUTS ON POLYPS AND HEMORRHOIDS GIVEN.  YOU HAD AN ENDOSCOPIC PROCEDURE TODAY AT [facility]:   Refer to the procedure report that was given to you for any specific questions about what was found during the examination.  If the procedure report does not answer your questions, please call your gastroenterologist to clarify.  If you requested that your care partner not be given the details of your procedure findings, then the procedure report has been included in a sealed envelope for you to review at your convenience later.  YOU SHOULD EXPECT: Some feelings of bloating in the abdomen. Passage of more gas than usual.  Walking can help get rid of the air that was put into your GI tract during the procedure and reduce the bloating. If you had a lower endoscopy (such as a colonoscopy or flexible sigmoidoscopy) you may notice spotting of blood in your stool or on the toilet paper. If you underwent a bowel prep for your procedure, you may not have a normal bowel movement for a few days.  Please Note:  You might notice some irritation and congestion in your nose or some drainage.  This is from the oxygen used during your procedure.  There is no need for concern and it should clear up in a day or so.  SYMPTOMS TO REPORT IMMEDIATELY:  Following lower endoscopy (colonoscopy or flexible sigmoidoscopy):  Excessive amounts of blood in the stool  Significant tenderness or worsening of abdominal pains  Swelling of the abdomen that is new, acute  Fever of 100F or higher  For urgent or emergent issues, a gastroenterologist can be reached at any hour by calling [phone]. Do not use MyChart messaging for urgent concerns.    DIET:  We do recommend a small meal at first, but then you may proceed to your regular diet.  Drink plenty of fluids but you should avoid alcoholic beverages for 24 hours.  ACTIVITY:  You should plan to take it easy for the rest of today and you should NOT DRIVE or  use heavy machinery until tomorrow (because of the sedation medicines used during the test).    FOLLOW UP: Our staff will call the number listed on your records the next business day following your procedure.  We will call around 7:15- 8:00 am to check on you and address any questions or concerns that you may have regarding the information given to you following your procedure. If we do not reach you, we will leave a message.  If you develop any symptoms (ie: fever, flu-like symptoms, shortness of breath, cough etc.) before then, please call [phone].  If you test positive for Covid 19 in the 2 weeks post procedure, please call and report this information to [place].    If any biopsies were taken you will be contacted by phone or by letter within the next 1-3 weeks.  Please call us at [phone] if you have not heard about the biopsies in 3 weeks.    SIGNATURES/CONFIDENTIALITY: You and/or your care partner have signed paperwork which will be entered into your electronic medical record.  These signatures attest to the fact that that the information above on your After Visit Summary has been reviewed and is understood.  Full responsibility of the confidentiality of this discharge information lies with you and/or your care-partner.

## 2021-12-02 NOTE — Progress Notes (Signed)
[facility] History and Physical   Primary Care Physician:  Haydee Salter, MD   Reason for Procedure:   Colon cancer screening  Plan:    colonoscopy     HPI: Dominic Davies is a 56 y.o. male  here for colonoscopy screening - first time exam. Patient denies any bowel symptoms at this time. No family history of colon cancer known. Otherwise feels well without any cardiopulmonary symptoms. I have discussed risks / benefits of colonoscopy and anesthesia with him and he wants to proceed.   Past Medical History:  Diagnosis Date   Allergy    Arthritis    Atrial fibrillation ([place])    last episode 2002- past hx anticoags- none current 2023   Hypertension    Irregular heart beat    Neuromuscular disorder (HCC)    neuropathy feet   Sleep apnea    has cpap - wears some    Past Surgical History:  Procedure Laterality Date   APPENDECTOMY  05/10/1995   CARDIOVERSION     due to a fib   INGUINAL HERNIA REPAIR Left 09/07/2011   INGUINAL HERNIA REPAIR Right    Young child    Prior to Admission medications   Medication Sig Start Date End Date Taking? Authorizing Provider  losartan-hydrochlorothiazide (HYZAAR) 100-25 MG tablet Take 1 tablet by mouth daily. 11/17/21  Yes Haydee Salter, MD  metoprolol tartrate (LOPRESSOR) 25 MG tablet Take 1 tablet by mouth twice daily 03/17/21  Yes Josue Hector, MD  potassium chloride SA (KLOR-CON M) 20 MEQ tablet Take 1 tablet (20 mEq total) by mouth daily. 11/17/21  Yes Haydee Salter, MD  Multiple Vitamin (MULTIVITAMIN) tablet Take 1 tablet by mouth daily.    [provider]    Current Outpatient Medications  Medication Sig Dispense Refill   losartan-hydrochlorothiazide (HYZAAR) 100-25 MG tablet Take 1 tablet by mouth daily. 90 tablet 3   metoprolol tartrate (LOPRESSOR) 25 MG tablet Take 1 tablet by mouth twice daily 180 tablet 2   potassium chloride SA (KLOR-CON M) 20 MEQ tablet Take 1 tablet (20 mEq total) by mouth daily.  90 tablet 3   Multiple Vitamin (MULTIVITAMIN) tablet Take 1 tablet by mouth daily.     Current Facility-Administered Medications  Medication Dose Route Frequency Provider Last Rate Last Admin   0.9 %  sodium chloride infusion  500 mL Intravenous Continuous Garnet Overfield, Carlota Raspberry, MD        Allergies as of 12/02/2021   (No Known Allergies)    Family History  Problem Relation Age of Onset   Cancer Mother        Breast   Heart disease Mother    Heart disease Father    Stroke Father    Parkinson's disease Father    Neuropathy Father    Colon polyps Sister    Hypertension Sister    Hypertension Sister    Hypertension Brother    Heart attack Other    Heart disease Other    Stroke Other    Hypertension Other    CAD Other    Colon cancer Neg Hx    Esophageal cancer Neg Hx    Rectal cancer Neg Hx    Stomach cancer Neg Hx     Social History   Socioeconomic History   Marital status: Married    Spouse name: Not on file   Number of children: 2   Years of education: Not on file   Highest education level: Associate  degree: academic program  Occupational History   Occupation: Embroidery    Comment: Product manager  Tobacco Use   Smoking status: Never   Smokeless tobacco: Never  Vaping Use   Vaping Use: Never used  Substance and Sexual Activity   Alcohol use: No    Alcohol/week: 0.0 standard drinks of alcohol   Drug use: No   Sexual activity: Yes  Other Topics Concern   Not on file  Social History Narrative   Works in [employer].  Caffeine- 1 cup daily.    Social Determinants of Health   Financial Resource Strain: Not on file  Food Insecurity: Not on file  Transportation Needs: Not on file  Physical Activity: Not on file  Stress: Not on file  Social Connections: Not on file  Intimate Partner Violence: Not on file    Review of Systems: All other review of systems negative except as mentioned in the HPI.  Physical Exam: Vital signs BP 132/84    Pulse (!) 54   Temp (!) 96.8 F (36 C) (Temporal)   Ht '6\' 1"'$  (1.854 m)   Wt 196 lb (88.9 kg)   SpO2 97%   BMI 25.86 kg/m   General:   Alert,  Well-developed, pleasant and cooperative in NAD Lungs:  Clear throughout to auscultation.   Heart:  Regular rate and rhythm Abdomen:  Soft, nontender and nondistended.   Neuro/Psych:  Alert and cooperative. Normal mood and affect. A and O x 3  Jolly Mango, MD [facility]

## 2021-12-03 ENCOUNTER — Telehealth: Payer: Self-pay

## 2021-12-03 NOTE — Telephone Encounter (Signed)
Unable to leave message mailbox full.

## 2021-12-19 ENCOUNTER — Other Ambulatory Visit: Payer: Self-pay | Admitting: Cardiovascular Disease

## 2021-12-20 ENCOUNTER — Other Ambulatory Visit: Payer: Self-pay

## 2021-12-22 ENCOUNTER — Ambulatory Visit: Payer: 59 | Admitting: Neurology

## 2021-12-29 DIAGNOSIS — M189 Osteoarthritis of first carpometacarpal joint, unspecified: Secondary | ICD-10-CM | POA: Insufficient documentation

## 2022-01-17 NOTE — Progress Notes (Unsigned)
Cardiology Office Note:    Date:  01/18/2022   ID:  Dominic Davies, DOB July 13, 1965, MRN [id number]  PCP:  Haydee Salter, MD  [facility] Cardiologist:  Jenkins Rouge, MD  [facility] Electrophysiologist:  None   Chief Complaint: yearly follow up   History of Present Illness:    Dominic Davies is a 56 y.o. male with a hx of PAF, PVC, HTN and OSA on CPAP seen for follow up.   Hx of afib in 2015. No issue since then. Has PVCs with improved on BB.   Monitor 07/2018:NSR, Infrequent isolated PVC;s. No significant arrhythmias Echo 10/16/18 with LVEF 60-65% no valve disease  Seen for follow up.  Patient had a low potassium by blood work in July.  Placed on potassium supplement by PCP however he self discontinued thinking that it may cause him to have headache.  However, it was improved after starting Flonase nasal spray.  Patient does not wear CPAP.  He is symptomatic with PVC.  He is symptomatic with PVC.  Last episode last night lasting for few minutes.  Past Medical History:  Diagnosis Date   Allergy    Arthritis    Atrial fibrillation ([place])    last episode 2002- past hx anticoags- none current 2023   Hypertension    Irregular heart beat    Neuromuscular disorder (HCC)    neuropathy feet   Sleep apnea    has cpap - wears some    Past Surgical History:  Procedure Laterality Date   APPENDECTOMY  05/10/1995   CARDIOVERSION     due to a fib   INGUINAL HERNIA REPAIR Left 09/07/2011   INGUINAL HERNIA REPAIR Right    Young child    Current Medications: Current Meds  Medication Sig   losartan-hydrochlorothiazide (HYZAAR) 100-25 MG tablet Take 1 tablet by mouth daily.   metoprolol tartrate (LOPRESSOR) 25 MG tablet Take 1 tablet by mouth twice daily   Multiple Vitamin (MULTIVITAMIN) tablet Take 1 tablet by mouth daily.   Na Sulfate-K Sulfate-Mg Sulf 17.5-3.13-1.6 GM/177ML SOLN Take by mouth as directed.   potassium chloride SA (KLOR-CON M) 20 MEQ tablet Take 1 tablet (20  mEq total) by mouth daily.     Allergies:   Patient has no known allergies.   Social History   Socioeconomic History   Marital status: Married    Spouse name: Not on file   Number of children: 2   Years of education: Not on file   Highest education level: Associate degree: academic program  Occupational History   Occupation: Embroidery    Comment: Product manager  Tobacco Use   Smoking status: Never   Smokeless tobacco: Never  Vaping Use   Vaping Use: Never used  Substance and Sexual Activity   Alcohol use: No    Alcohol/week: 0.0 standard drinks of alcohol   Drug use: No   Sexual activity: Yes  Other Topics Concern   Not on file  Social History Narrative   Works in [employer].  Caffeine- 1 cup daily.    Social Determinants of Health   Financial Resource Strain: Not on file  Food Insecurity: Not on file  Transportation Needs: Not on file  Physical Activity: Not on file  Stress: Not on file  Social Connections: Not on file     Family History: The patient's family history includes CAD in an other family member; Cancer in his mother; Colon polyps in his sister; Heart attack in an other  family member; Heart disease in his father, mother, and another family member; Hypertension in his brother, sister, sister, and another family member; Neuropathy in his father; Parkinson's disease in his father; Stroke in his father and another family member. There is no history of Colon cancer, Esophageal cancer, Rectal cancer, or Stomach cancer.    ROS:   Please see the history of present illness.    All other systems reviewed and are negative.   EKGs/Labs/Other Studies Reviewed:    The following studies were reviewed today: [facility]  10/2018 1. The left ventricle has normal systolic function with an ejection  fraction of 60-65%. The cavity size was normal. Left ventricular diastolic  parameters were normal. No evidence of left ventricular regional wall  motion  abnormalities.   2. The right ventricle has normal systolic function. The cavity was  normal. There is no increase in right ventricular wall thickness. Right  ventricular systolic pressure is normal with an estimated pressure of 22.2  mmHg.   3. The aortic root is normal in size and structure.   EKG:  EKG is  ordered today.  The ekg ordered today demonstrates sinus bradycardia at rate of 59 bpm  Recent Labs: 06/24/2021: TSH 2.040 11/17/2021: BUN 21; Creatinine, Ser 1.09; Potassium 3.1; Sodium 134  Recent Lipid Panel    Component Value Date/Time   CHOL 201 (H) 04/14/2021 0947   TRIG 310.0 (H) 04/14/2021 0947   HDL 37.90 (L) 04/14/2021 0947   CHOLHDL 5 04/14/2021 0947   VLDL 62.0 (H) 04/14/2021 0947   LDLDIRECT 133.0 04/14/2021 0947     Risk Assessment/Calculations:    CHA2DS2-VASc Score = 1   This indicates a 0.6% annual risk of stroke. The patient's score is based upon: CHF History: 0 HTN History: 1 Diabetes History: 0 Stroke History: 0 Vascular Disease History: 0 Age Score: 0 Gender Score: 0       Physical Exam:    VS:  BP 108/70   Pulse (!) 59   Ht 6' (1.829 m)   Wt 192 lb (87.1 kg)   SpO2 96%   BMI 26.04 kg/m     Wt Readings from Last 3 Encounters:  01/18/22 192 lb (87.1 kg)  12/02/21 196 lb (88.9 kg)  11/17/21 195 lb 3.2 oz (88.5 kg)     GEN:  Well nourished, well developed in no acute distress HEENT: Normal NECK: No JVD; No carotid bruits LYMPHATICS: No lymphadenopathy CARDIAC: RRR, no murmurs, rubs, gallops RESPIRATORY:  Clear to auscultation without rales, wheezing or rhonchi  ABDOMEN: Soft, non-tender, non-distended MUSCULOSKELETAL:  No edema; No deformity  SKIN: Warm and dry NEUROLOGIC:  Alert and oriented x 3 PSYCHIATRIC:  Normal affect   ASSESSMENT AND PLAN:    1.  Paroxysmal atrial fibrillation No recurrent episode lately.  CHA2DS2-VASc score of 1.  Not on anticoagulation.  Continue beta-blocker.  2.  Symptomatic PVC Occurring very  seldom.  Last episode last night.  Recent hypokalemia.  Will recheck BMP.  Discussed electrolyte balance.  Continue beta-blocker.  He is symptomatic with PVC.  3.  Hypertension -Blood pressure controlled on current medication  4.  Obstructive sleep apnea -Noncompliant with CPAP  Medication Adjustments/Labs and Tests Ordered: Current medicines are reviewed at length with the patient today.  Concerns regarding medicines are outlined above.  Orders Placed This Encounter  Procedures   Basic Metabolic Panel (BMET)   EKG 12-Lead   No orders of the defined types were placed in this encounter.   Patient Instructions  Medication Instructions:  Your physician recommends that you continue on your current medications as directed. Please refer to the Current Medication list given to you today. *If you need a refill on your cardiac medications before your next appointment, please call your pharmacy*   Lab Work: TODAY-BMET If you have labs (blood work) drawn today and your tests are completely normal, you will receive your results only by: [place] (if you have MyChart) OR A paper copy in the mail If you have any lab test that is abnormal or we need to change your treatment, we will call you to review the results.   Testing/Procedures: None Ordered   Follow-Up: At [facility], you and your health needs are our priority.  As part of our continuing mission to provide you with exceptional heart care, we have created designated Provider Care Teams.  These Care Teams include your primary Cardiologist (physician) and Advanced Practice Providers (APPs -  Physician Assistants and Nurse Practitioners) who all work together to provide you with the care you need, when you need it.  We recommend signing up for the patient portal called "MyChart".  Sign up information is provided on this After Visit Summary.  MyChart is used to connect with patients for Virtual Visits (Telemedicine).   Patients are able to view lab/test results, encounter notes, upcoming appointments, etc.  Non-urgent messages can be sent to your provider as well.   To learn more about what you can do with MyChart, go to [email or web address].    Your next appointment:   12 month(s)  The format for your next appointment:   In Person  Provider:   Jenkins Rouge, MD     Other Instructions   Important Information About Sugar         Signed, Leanor Kail, [place]  01/18/2022 9:00 AM    [place]

## 2022-01-18 ENCOUNTER — Other Ambulatory Visit: Payer: Self-pay | Admitting: Cardiovascular Disease

## 2022-01-18 ENCOUNTER — Ambulatory Visit: Payer: 59 | Attending: Physician Assistant | Admitting: Physician Assistant

## 2022-01-18 ENCOUNTER — Encounter: Payer: Self-pay | Admitting: Physician Assistant

## 2022-01-18 VITALS — BP 108/70 | HR 59 | Ht 72.0 in | Wt 192.0 lb

## 2022-01-18 DIAGNOSIS — I493 Ventricular premature depolarization: Secondary | ICD-10-CM

## 2022-01-18 DIAGNOSIS — E876 Hypokalemia: Secondary | ICD-10-CM

## 2022-01-18 DIAGNOSIS — I480 Paroxysmal atrial fibrillation: Secondary | ICD-10-CM

## 2022-01-18 DIAGNOSIS — Z79899 Other long term (current) drug therapy: Secondary | ICD-10-CM | POA: Diagnosis not present

## 2022-01-18 DIAGNOSIS — G4733 Obstructive sleep apnea (adult) (pediatric): Secondary | ICD-10-CM

## 2022-01-18 NOTE — Patient Instructions (Signed)
Medication Instructions:  Your physician recommends that you continue on your current medications as directed. Please refer to the Current Medication list given to you today. *If you need a refill on your cardiac medications before your next appointment, please call your pharmacy*   Lab Work: TODAY-BMET If you have labs (blood work) drawn today and your tests are completely normal, you will receive your results only by: [place] (if you have MyChart) OR A paper copy in the mail If you have any lab test that is abnormal or we need to change your treatment, we will call you to review the results.   Testing/Procedures: None Ordered   Follow-Up: At [facility], you and your health needs are our priority.  As part of our continuing mission to provide you with exceptional heart care, we have created designated Provider Care Teams.  These Care Teams include your primary Cardiologist (physician) and Advanced Practice Providers (APPs -  Physician Assistants and Nurse Practitioners) who all work together to provide you with the care you need, when you need it.  We recommend signing up for the patient portal called "MyChart".  Sign up information is provided on this After Visit Summary.  MyChart is used to connect with patients for Virtual Visits (Telemedicine).  Patients are able to view lab/test results, encounter notes, upcoming appointments, etc.  Non-urgent messages can be sent to your provider as well.   To learn more about what you can do with MyChart, go to [email or web address].    Your next appointment:   12 month(s)  The format for your next appointment:   In Person  Provider:   Jenkins Rouge, MD     Other Instructions   Important Information About Sugar

## 2022-01-19 LAB — BASIC METABOLIC PANEL
BUN/Creatinine Ratio: 17 (ref 9–20)
BUN: 18 mg/dL (ref 6–24)
CO2: 24 mmol/L (ref 20–29)
Calcium: 9.6 mg/dL (ref 8.7–10.2)
Chloride: 100 mmol/L (ref 96–106)
Creatinine, Ser: 1.06 mg/dL (ref 0.76–1.27)
Glucose: 105 mg/dL — ABNORMAL HIGH (ref 70–99)
Potassium: 3.7 mmol/L (ref 3.5–5.2)
Sodium: 139 mmol/L (ref 134–144)
eGFR: 82 mL/min/{1.73_m2} (ref >59)

## 2022-01-30 IMAGING — CT CT ANGIO CHEST
2 of 6 series · 18 of 36 positions shown · IV contrast (OMNIPAQUE 350)
Comparison: None.

CLINICAL DATA: Persistent cough and shortness breath heights is Dr.
You had

EXAM:
CT ANGIOGRAPHY CHEST WITH CONTRAST
TECHNIQUE: Multidetector CT imaging of the chest was performed using the
standard protocol during bolus administration of intravenous
contrast. Multiplanar CT image reconstructions and MIPs were
obtained to evaluate the vascular anatomy.
CONTRAST:  100mL OMNIPAQUE IOHEXOL 350 MG/ML SOLN

[Series 5: thins · axial · 0.73mm/px · z∈[+1368,+1606]mm · 17 of 269 slices shown]
[im 15/269  lung]
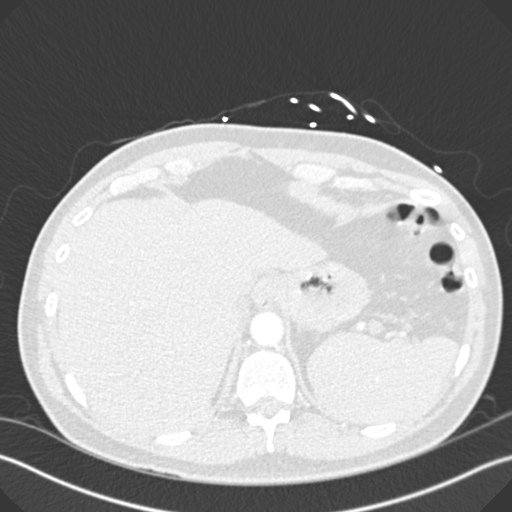
[im 30/269  mediastinal]
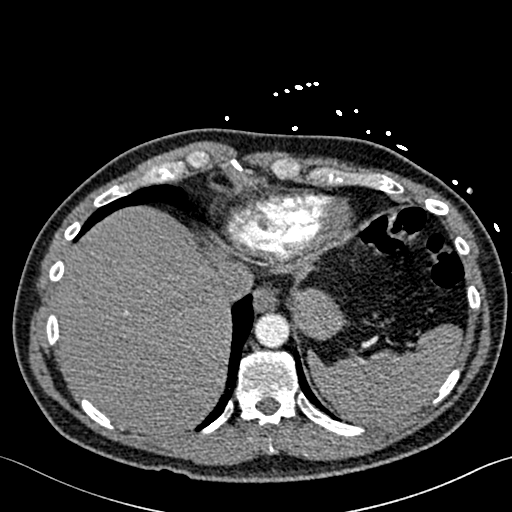
[im 45/269  lung]
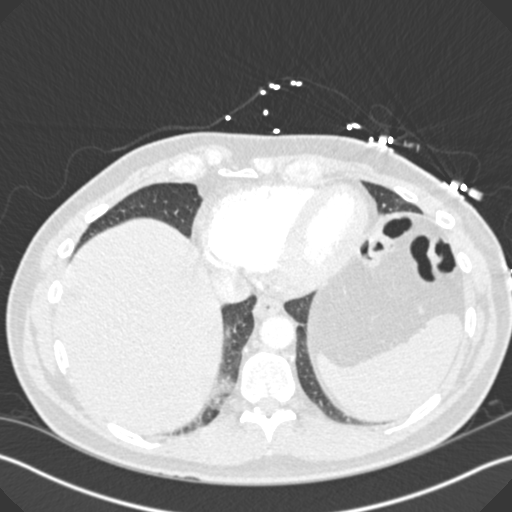
[im 60/269  mediastinal]
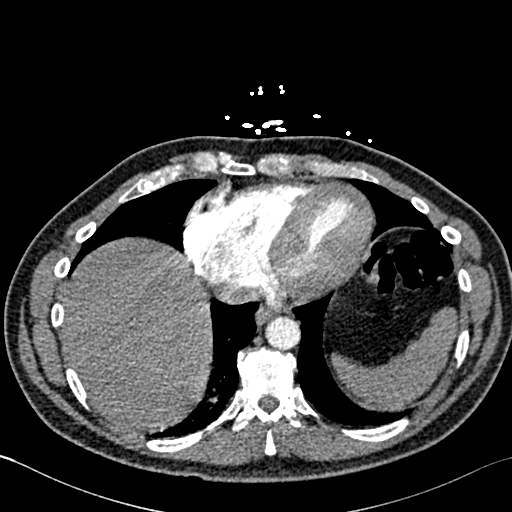
[im 75/269  lung]
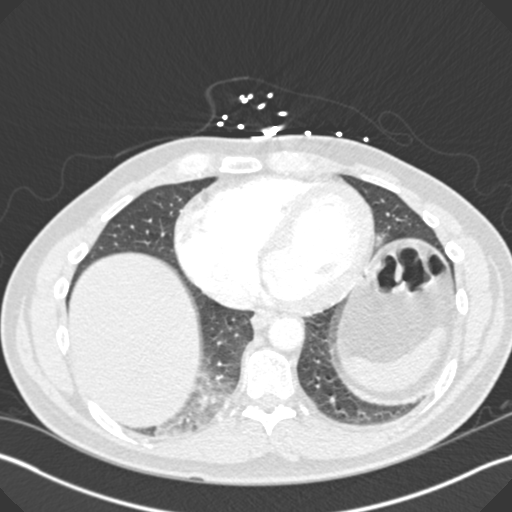
[im 90/269  mediastinal]
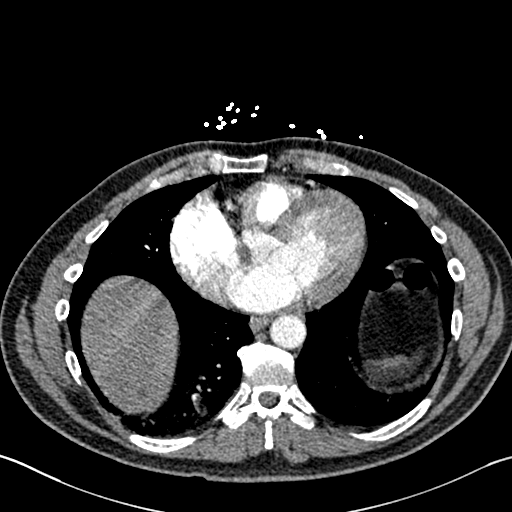
[im 105/269  lung]
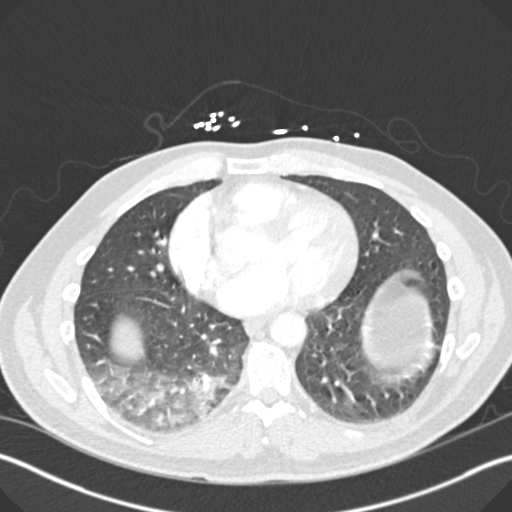
[im 120/269  mediastinal]
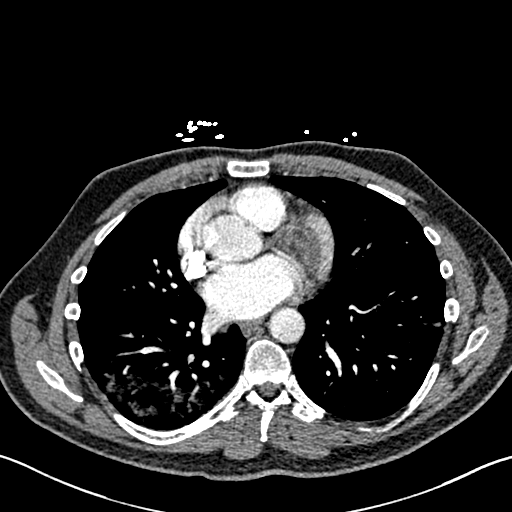
[im 135/269  lung]
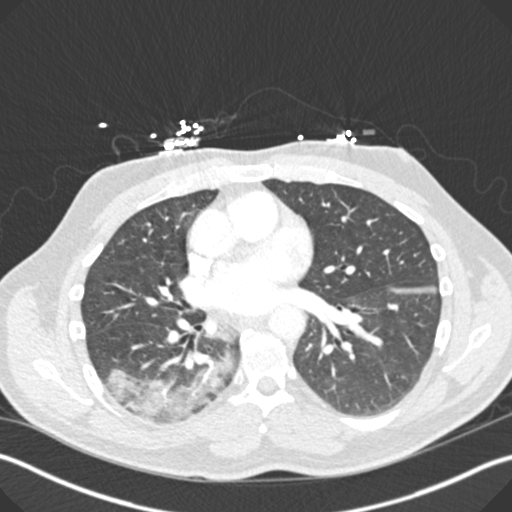
[im 149/269  mediastinal]
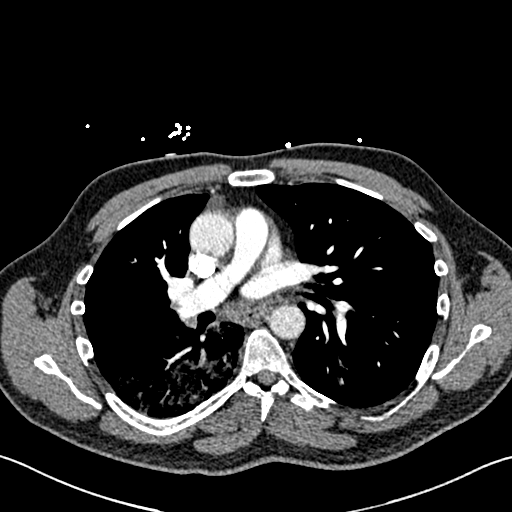
[im 164/269  lung]
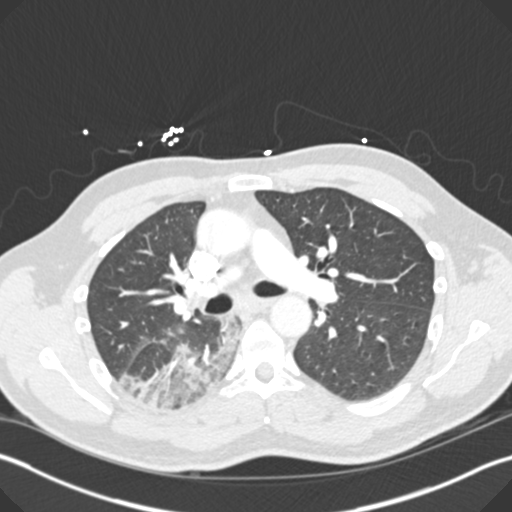
[im 179/269  mediastinal]
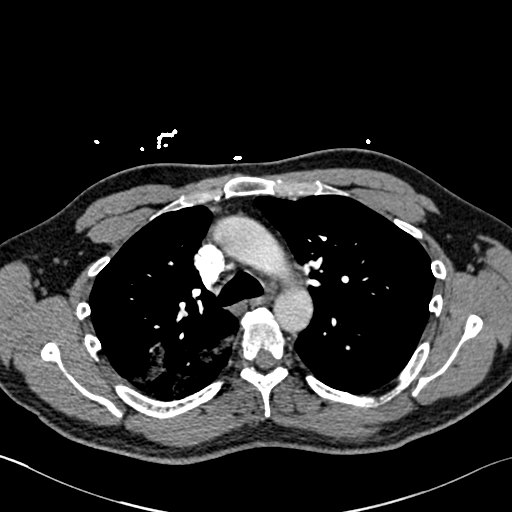
[im 194/269  lung]
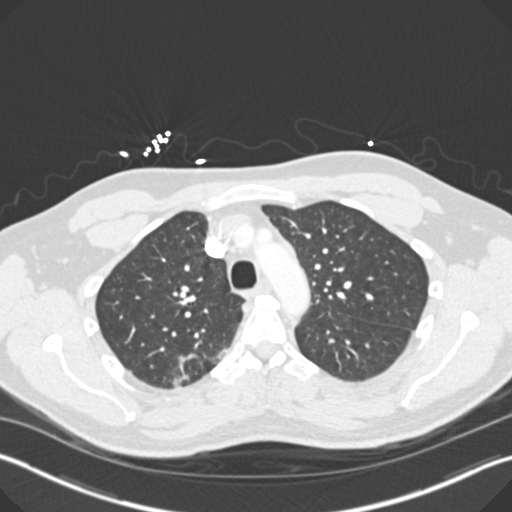
[im 209/269  mediastinal]
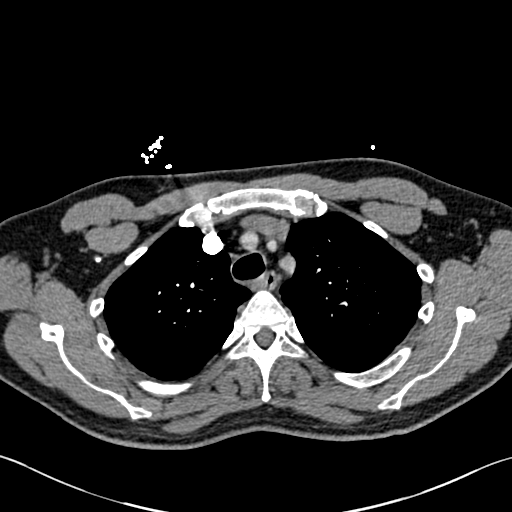
[im 224/269  lung]
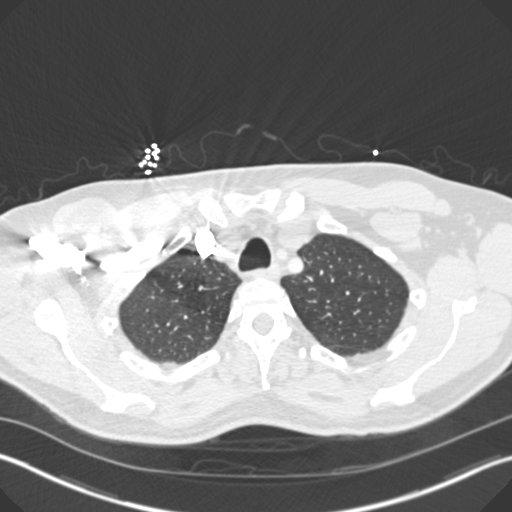
[im 239/269  mediastinal]
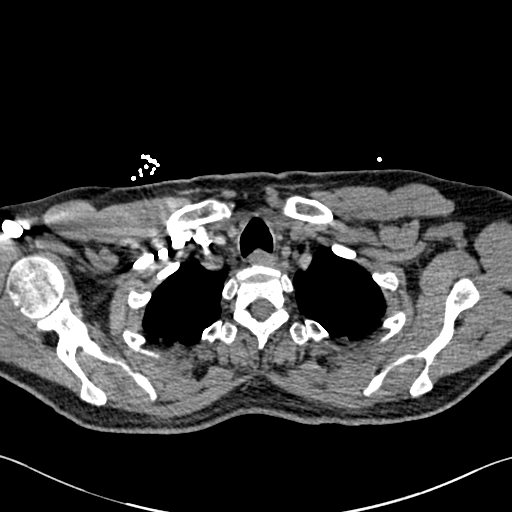
[im 254/269  lung]
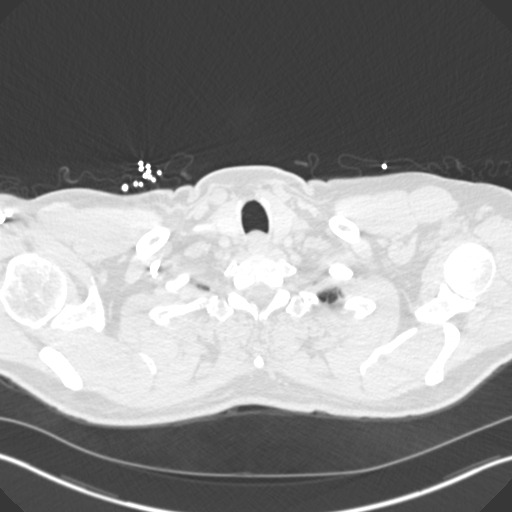

[Series 7: coronal mpr · coronal · 0.52mm/px · 1 of 123 slices shown]
[im 62/123  mediastinal]
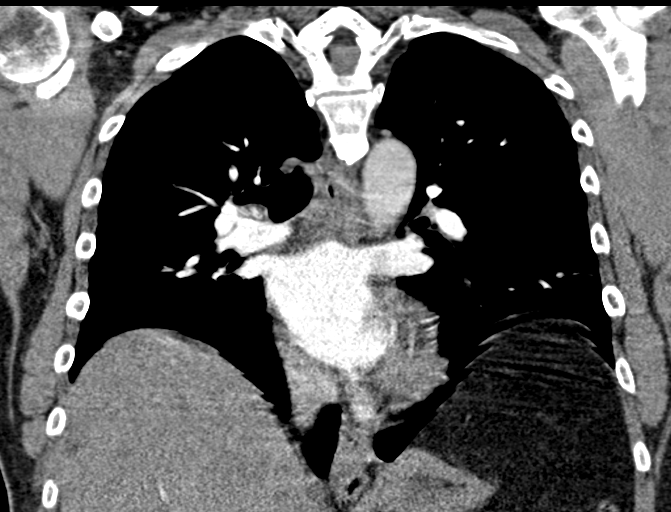

[18 of 36 positions shown; findings below may reference images not displayed]

FINDINGS: Cardiovascular: There is a optimal opacification of the pulmonary
arteries. There is no central,segmental, or subsegmental filling
defects within the pulmonary arteries. The heart is normal in size.
No pericardial effusion or thickening. No evidence right heart
strain. There is normal three-vessel brachiocephalic anatomy without
proximal stenosis. The thoracic aorta is normal in appearance.

Mediastinum/Nodes: No hilar, mediastinal, or axillary adenopathy.
Thyroid gland, trachea, and esophagus demonstrate no significant
findings.

Lungs/Pleura: Patchy/ground-glass opacity seen throughout the
posterior right upper and lower lung ,as well as the posterior left
lower lung. No air bronchograms or pleural effusion are seen.

Upper Abdomen: There is a 1 cm low-density lesion in the right
hepatic lobe.

Musculoskeletal: No chest wall abnormality. No acute or significant
osseous findings.

Review of the MIP images confirms the above findings.
IMPRESSION: No central, segmental, or subsegmental pulmonary embolism.

Multifocal posterior patchy/ground-glass opacity seen throughout the
posterior right lung and posterior left lower lung, likely
consistent with multifocal pneumonia.

## 2022-02-14 ENCOUNTER — Encounter: Payer: Self-pay | Admitting: Family Medicine

## 2022-02-14 ENCOUNTER — Ambulatory Visit: Payer: 59 | Admitting: Family Medicine

## 2022-02-14 VITALS — BP 124/76 | HR 59 | Temp 97.3°F | Ht 72.0 in | Wt 192.8 lb

## 2022-02-14 DIAGNOSIS — I10 Essential (primary) hypertension: Secondary | ICD-10-CM | POA: Diagnosis not present

## 2022-02-14 DIAGNOSIS — E876 Hypokalemia: Secondary | ICD-10-CM

## 2022-02-14 DIAGNOSIS — I493 Ventricular premature depolarization: Secondary | ICD-10-CM

## 2022-02-14 NOTE — Progress Notes (Signed)
[facility] [address] [place] [address] Dept: [phone] Dept Fax: [phone]  Chronic Care Office Visit  Subjective:    Patient ID: Dominic Davies, male    DOB: 08/19/1965, 56 y.o..   MRN: [id number]  Chief Complaint  Patient presents with   Follow-up    F/u HTN.  No concerns.   Not fasting today.      History of Present Illness:  Patient is in today for reassessment of chronic medical issues.  Dominic Davies has a history of hypertension. He is managed on Hyzaar 100-25 mg daily. He had a low potassium at his last visit. I had started him on a potassium supplement. He noted he had developed a headache issue that he thought might be related, so stopped the potassium. He later figured out his headaches were coming form his use of Flonase. He is back to taking some potassium again.   Dominic Davies has a history of palpitations due to frequent PVCs. He is currently managed with metoprolol 25 mg bid. He recently was seen by cardiology for follow up and no changes were made.  Past Medical History: Patient Active Problem List   Diagnosis Date Noted   Osteoarthritis of carpometacarpal [facility]) joint of thumb 12/29/2021   Hypokalemia 11/17/2021   Polyneuropathy 06/27/2021   Hyperlipidemia 04/14/2021   Obstructive sleep apnea 04/13/2021   Frequent PVCs 04/13/2021   Essential hypertension 05/11/2018   History of atrial fibrillation 04/13/2018   Palpitations 05/06/2014   Past Surgical History:  Procedure Laterality Date   APPENDECTOMY  05/10/1995   CARDIOVERSION     due to a fib   INGUINAL HERNIA REPAIR Left 09/07/2011   INGUINAL HERNIA REPAIR Right    Young child   Family History  Problem Relation Age of Onset   Cancer Mother        Breast   Heart disease Mother    Heart disease Father    Stroke Father    Parkinson's disease Father    Neuropathy Father    Colon polyps Sister    Hypertension Sister    Hypertension  Sister    Hypertension Brother    Heart attack Other    Heart disease Other    Stroke Other    Hypertension Other    CAD Other    Colon cancer Neg Hx    Esophageal cancer Neg Hx    Rectal cancer Neg Hx    Stomach cancer Neg Hx    Outpatient Medications Prior to Visit  Medication Sig Dispense Refill   losartan-hydrochlorothiazide (HYZAAR) 100-25 MG tablet Take 1 tablet by mouth daily. 90 tablet 3   metoprolol tartrate (LOPRESSOR) 25 MG tablet Take 1 tablet (25 mg total) by mouth 2 (two) times daily. 180 tablet 1   Multiple Vitamin (MULTIVITAMIN) tablet Take 1 tablet by mouth daily.     potassium chloride SA (KLOR-CON M) 20 MEQ tablet Take 1 tablet (20 mEq total) by mouth daily. 90 tablet 3   Na Sulfate-K Sulfate-Mg Sulf 17.5-3.13-1.6 GM/177ML SOLN Take by mouth as directed.     No facility-administered medications prior to visit.   No Known Allergies    Objective:   Today's Vitals   02/14/22 0851  BP: 124/76  Pulse: (!) 59  Temp: (!) 97.3 F (36.3 C)  TempSrc: Temporal  SpO2: 98%  Weight: 192 lb 12.8 oz (87.5 kg)  Height: 6' (1.829 m)   Body mass index is 26.15 kg/m.  General: Well developed, well nourished. No acute distress. Psych: Alert and oriented. Normal mood and affect.  Health Maintenance Due  Topic Date Due   Zoster Vaccines- Shingrix (1 of 2) Never done   Lab Results    Latest Ref Rng & Units 01/18/2022    9:02 AM 11/17/2021    9:57 AM 10/13/2020   10:46 AM  BMP  Glucose 70 - 99 mg/dL 105  143  102   BUN 6 - 24 mg/dL '18  21  16   '$ Creatinine 0.76 - 1.27 mg/dL 1.06  1.09  1.01   BUN/Creat Ratio 9 - '20 17   16   '$ Sodium 134 - 144 mmol/L 139  134  138   Potassium 3.5 - 5.2 mmol/L 3.7  3.1  4.1   Chloride 96 - 106 mmol/L 100  99  100   CO2 20 - 29 mmol/L '24  25  22   '$ Calcium 8.7 - 10.2 mg/dL 9.6  9.4  9.5      Assessment & Plan:   1. Essential hypertension Blood pressure is in good control. Continue Hyzaar 100-25 mg daily.  2. Frequent PVCs Stable  on metoprolol 25 mg bid.  3. Hypokalemia Potasium was back to normal range on recent repeat blood test. I recommend he consider continuing his  20 meq daily potassium supplement.   Return in about 4 months (around 06/17/2022) for Reassessment.   Haydee Salter, MD

## 2022-06-17 ENCOUNTER — Ambulatory Visit: Payer: 59 | Admitting: Family Medicine

## 2022-06-17 ENCOUNTER — Encounter: Payer: Self-pay | Admitting: Family Medicine

## 2022-06-17 VITALS — BP 118/74 | HR 63 | Temp 97.6°F | Ht 72.0 in | Wt 192.4 lb

## 2022-06-17 DIAGNOSIS — E876 Hypokalemia: Secondary | ICD-10-CM

## 2022-06-17 DIAGNOSIS — R0609 Other forms of dyspnea: Secondary | ICD-10-CM

## 2022-06-17 DIAGNOSIS — I493 Ventricular premature depolarization: Secondary | ICD-10-CM | POA: Diagnosis not present

## 2022-06-17 DIAGNOSIS — E782 Mixed hyperlipidemia: Secondary | ICD-10-CM | POA: Diagnosis not present

## 2022-06-17 DIAGNOSIS — I10 Essential (primary) hypertension: Secondary | ICD-10-CM | POA: Diagnosis not present

## 2022-06-17 LAB — BASIC METABOLIC PANEL
BUN: 17 mg/dL (ref 6–23)
CO2: 30 mEq/L (ref 19–32)
Calcium: 9.6 mg/dL (ref 8.4–10.5)
Chloride: 98 mEq/L (ref 96–112)
Creatinine, Ser: 1.09 mg/dL (ref 0.40–1.50)
GFR: 75.5 mL/min (ref >60.00)
Glucose, Bld: 103 mg/dL — ABNORMAL HIGH (ref 70–99)
Potassium: 4.1 mEq/L (ref 3.5–5.1)
Sodium: 138 mEq/L (ref 135–145)

## 2022-06-17 LAB — LIPID PANEL
Cholesterol: 213 mg/dL — ABNORMAL HIGH (ref 0–200)
HDL: 33.3 mg/dL — ABNORMAL LOW (ref >39.00)
NonHDL: 179.54
Total CHOL/HDL Ratio: 6
Triglycerides: 233 mg/dL — ABNORMAL HIGH (ref 0.0–149.0)
VLDL: 46.6 mg/dL — ABNORMAL HIGH (ref 0.0–40.0)

## 2022-06-17 LAB — LDL CHOLESTEROL, DIRECT: Direct LDL: 142 mg/dL

## 2022-06-17 NOTE — Assessment & Plan Note (Signed)
Blood pressure is in good control. Continue Hyzaar 100-25 mg daily.

## 2022-06-17 NOTE — Assessment & Plan Note (Signed)
We will reassess lipids today.

## 2022-06-17 NOTE — Assessment & Plan Note (Signed)
I will recheck his potassium today.

## 2022-06-17 NOTE — Assessment & Plan Note (Signed)
By ausculation, he is having an occasional PVC. Continue metoprolol tartrate 25 mg bid.

## 2022-06-17 NOTE — Progress Notes (Signed)
[facility] [address] [place] [address] Dept: [phone] Dept Fax: [phone]  Chronic Care Office Visit  Subjective:    Patient ID: GOLDMAN CULLOM, male    DOB: 12/07/1965, 57 y.o..   MRN: [id number]  Chief Complaint  Patient presents with   Medical Management of Chronic Issues    4 month f/u.  C/o having SOB off/on.  Fasting today.     History of Present Illness:  Patient is in today for reassessment of chronic medical issues.  Mr. Bredahl has a history of hypertension. He is managed on Hyzaar 100-25 mg daily. He has had a low potassium this past year. He is off of his potassium currently.   Mr. Calkin has a history of palpitations due to frequent PVCs. He is currently managed with metoprolol 25 mg bid.   Mr. Freet notes that over the past year, he has had some episodic dyspnea, typically with exertion, but sometimes after standing up.  These episodes can go on for 15 min. They do not happen all of the time. He has a remote history of atrial fibrillation, but notes this was 20 years ago.   Past Medical History: Patient Active Problem List   Diagnosis Date Noted   Osteoarthritis of carpometacarpal [facility]) joint of thumb 12/29/2021   Hypokalemia 11/17/2021   Polyneuropathy 06/27/2021   Hyperlipidemia 04/14/2021   Obstructive sleep apnea 04/13/2021   Frequent PVCs 04/13/2021   Essential hypertension 05/11/2018   History of atrial fibrillation 04/13/2018   Palpitations 05/06/2014   Past Surgical History:  Procedure Laterality Date   APPENDECTOMY  05/10/1995   CARDIOVERSION     due to a fib   INGUINAL HERNIA REPAIR Left 09/07/2011   INGUINAL HERNIA REPAIR Right    Young child   Family History  Problem Relation Age of Onset   Cancer Mother        Breast   Heart disease Mother    Heart disease Father    Stroke Father    Parkinson's disease Father    Neuropathy Father    Colon polyps Sister     Hypertension Sister    Hypertension Sister    Hypertension Brother    Heart attack Other    Heart disease Other    Stroke Other    Hypertension Other    CAD Other    Colon cancer Neg Hx    Esophageal cancer Neg Hx    Rectal cancer Neg Hx    Stomach cancer Neg Hx    Outpatient Medications Prior to Visit  Medication Sig Dispense Refill   losartan-hydrochlorothiazide (HYZAAR) 100-25 MG tablet Take 1 tablet by mouth daily. 90 tablet 3   metoprolol tartrate (LOPRESSOR) 25 MG tablet Take 1 tablet (25 mg total) by mouth 2 (two) times daily. 180 tablet 1   Multiple Vitamin (MULTIVITAMIN) tablet Take 1 tablet by mouth daily.     potassium chloride SA (KLOR-CON M) 20 MEQ tablet Take 1 tablet (20 mEq total) by mouth daily. 90 tablet 3   No facility-administered medications prior to visit.   No Known Allergies    Objective:   Today's Vitals   06/17/22 1013  BP: 118/74  Pulse: 63  Temp: 97.6 F (36.4 C)  TempSrc: Temporal  SpO2: 99%  Weight: 192 lb 6.4 oz (87.3 kg)  Height: 6' (1.829 m)   Body mass index is 26.09 kg/m.   General: Well developed, well nourished. No acute distress. Lungs: Clear to  auscultation bilaterally. No wheezing, rales or rhonchi. CV: RRR without murmurs or rubs. Occasional early beat noted. Pulses 2+ bilaterally. Extremities: No edema noted. Psych: Alert and oriented. Normal mood and affect.  Health Maintenance Due  Topic Date Due   Zoster Vaccines- Shingrix (1 of 2) Never done     Assessment & Plan:   Problem List Items Addressed This Visit       Cardiovascular and Mediastinum   Essential hypertension - Primary    Blood pressure is in good control. Continue Hyzaar 100-25 mg daily.       Relevant Orders   Basic metabolic panel   Frequent PVCs    By ausculation, he is having an occasional PVC. Continue metoprolol tartrate 25 mg bid.        Other   Hyperlipidemia    We will reassess lipids today.      Relevant Orders   Lipid panel    Hypokalemia    I will recheck his potassium today.      Relevant Orders   Basic metabolic panel   Other Visit Diagnoses     Dyspnea on exertion           Return in about 3 months (around 09/15/2022) for Reassessment.   Haydee Salter, MD

## 2022-07-23 ENCOUNTER — Other Ambulatory Visit: Payer: Self-pay | Admitting: Cardiovascular Disease

## 2022-09-20 ENCOUNTER — Encounter: Payer: Self-pay | Admitting: Family Medicine

## 2022-09-20 ENCOUNTER — Ambulatory Visit: Payer: 59 | Admitting: Family Medicine

## 2022-09-20 VITALS — BP 124/80 | HR 61 | Temp 97.1°F | Ht 72.0 in | Wt 191.4 lb

## 2022-09-20 DIAGNOSIS — I10 Essential (primary) hypertension: Secondary | ICD-10-CM

## 2022-09-20 DIAGNOSIS — E782 Mixed hyperlipidemia: Secondary | ICD-10-CM

## 2022-09-20 MED ORDER — LOSARTAN POTASSIUM-HCTZ 100-25 MG PO TABS: 1.0000 | ORAL_TABLET | Freq: Every day | ORAL | 3 refills | Status: DC

## 2022-09-20 NOTE — Assessment & Plan Note (Signed)
AHA/ACC cardiovascular risk score shows a 10.9 % (intermediate) risk of a MI or stroke in the next 10 years. We discussed Dominic Davies focusing on eating a heart-healthy diet (DASH diet or Mediterranean), getting 150 minutes of moderate-intensity exercise each week, maintaining a normal weight, and avoiding tobacco products. I recommended he consider taking a statin. He does not have strong risk enhancers, so for now we will monitor this.

## 2022-09-20 NOTE — Progress Notes (Signed)
[facility] [address] [place] [address] Dept: [phone] Dept Fax: [phone]  Chronic Care Office Visit  Subjective:    Patient ID: Dominic Davies, male    DOB: 21-May-1965, 57 y.o..   MRN: [id number]  Chief Complaint  Patient presents with   Medical Management of Chronic Issues    3 month f/u.  No concerns.  Fasting today.   Average BP at home 121/80 - 134/80   History of Present Illness:  Patient is in today for reassessment of chronic medical issues.  Dominic Davies has a history of hypertension. He is managed on Hyzaar 100-25 mg daily. He is monitoring his BP at home. He is seeing 127-134/80-89.   Dominic Davies has a history of palpitations due to frequent PVCs. He is currently managed with metoprolol 25 mg bid.    Dominic Davies has a history of hyperlipidemia. He has a family history of CVD with his father having had 4 stents and his first heart attack in his late 99s. Dominic Davies has tried to make good dietary choices and is staying active.  Past Medical History: Patient Active Problem List   Diagnosis Date Noted   Osteoarthritis of carpometacarpal [facility]) joint of thumb 12/29/2021   Hypokalemia 11/17/2021   Polyneuropathy 06/27/2021   Hyperlipidemia 04/14/2021   Obstructive sleep apnea 04/13/2021   Frequent PVCs 04/13/2021   Essential hypertension 05/11/2018   History of atrial fibrillation 04/13/2018   Palpitations 05/06/2014   Past Surgical History:  Procedure Laterality Date   APPENDECTOMY  05/10/1995   CARDIOVERSION     due to a fib   INGUINAL HERNIA REPAIR Left 09/07/2011   INGUINAL HERNIA REPAIR Right    Young child   Family History  Problem Relation Age of Onset   Cancer Mother        Breast   Heart disease Mother    Heart disease Father    Stroke Father    Parkinson's disease Father    Neuropathy Father    Colon polyps Sister    Hypertension Sister    Hypertension Sister    Hypertension  Brother    Heart attack Other    Heart disease Other    Stroke Other    Hypertension Other    CAD Other    Colon cancer Neg Hx    Esophageal cancer Neg Hx    Rectal cancer Neg Hx    Stomach cancer Neg Hx    Outpatient Medications Prior to Visit  Medication Sig Dispense Refill   metoprolol tartrate (LOPRESSOR) 25 MG tablet Take 1 tablet by mouth twice daily 180 tablet 1   Multiple Vitamin (MULTIVITAMIN) tablet Take 1 tablet by mouth daily.     losartan-hydrochlorothiazide (HYZAAR) 100-25 MG tablet Take 1 tablet by mouth daily. 90 tablet 3   No facility-administered medications prior to visit.   No Known Allergies Objective:   Today's Vitals   09/20/22 0903  BP: 124/80  Pulse: 61  Temp: (!) 97.1 F (36.2 C)  TempSrc: Temporal  SpO2: 98%  Weight: 191 lb 6.4 oz (86.8 kg)  Height: 6' (1.829 m)   Body mass index is 25.96 kg/m.   General: Well developed, well nourished. No acute distress. Psych: Alert and oriented. Normal mood and affect.  Health Maintenance Due  Topic Date Due   Zoster Vaccines- Shingrix (1 of 2) Never done   Lab Results Last lipids Lab Results  Component Value Date   CHOL 213 (H)  06/17/2022   HDL 33.30 (L) 06/17/2022   LDLDIRECT 142.0 06/17/2022   TRIG 233.0 (H) 06/17/2022   CHOLHDL 6 06/17/2022   Assessment & Plan:   Problem List Items Addressed This Visit       Cardiovascular and Mediastinum   Essential hypertension    Blood pressure is in good control. Continue Hyzaar 100-25 mg daily.       Relevant Medications   losartan-hydrochlorothiazide (HYZAAR) 100-25 MG tablet     Other   Hyperlipidemia - Primary    AHA/ACC cardiovascular risk score shows a 10.9 % (intermediate) risk of a MI or stroke in the next 10 years. We discussed Dominic Davies focusing on eating a heart-healthy diet (DASH diet or Mediterranean), getting 150 minutes of moderate-intensity exercise each week, maintaining a normal weight, and avoiding tobacco products. I  recommended he consider taking a statin. He does not have strong risk enhancers, so for now we will monitor this.       Relevant Medications   losartan-hydrochlorothiazide (HYZAAR) 100-25 MG tablet    Return in about 4 months (around 01/21/2023) for Reassessment.   Loyola Mast, MD

## 2022-09-20 NOTE — Assessment & Plan Note (Signed)
Blood pressure is in good control.   -Continue Hyzaar 100-25 mg daily 

## 2022-11-11 ENCOUNTER — Encounter: Payer: Self-pay | Admitting: Gastroenterology

## 2023-01-05 ENCOUNTER — Ambulatory Visit: Payer: 59 | Admitting: Family Medicine

## 2023-01-05 ENCOUNTER — Encounter: Payer: Self-pay | Admitting: Family Medicine

## 2023-01-05 VITALS — BP 118/78 | HR 67 | Temp 98.3°F | Ht 72.0 in | Wt 187.0 lb

## 2023-01-05 DIAGNOSIS — E876 Hypokalemia: Secondary | ICD-10-CM | POA: Diagnosis not present

## 2023-01-05 DIAGNOSIS — I10 Essential (primary) hypertension: Secondary | ICD-10-CM | POA: Diagnosis not present

## 2023-01-05 DIAGNOSIS — R2242 Localized swelling, mass and lump, left lower limb: Secondary | ICD-10-CM

## 2023-01-05 DIAGNOSIS — I493 Ventricular premature depolarization: Secondary | ICD-10-CM | POA: Diagnosis not present

## 2023-01-05 DIAGNOSIS — R0609 Other forms of dyspnea: Secondary | ICD-10-CM | POA: Diagnosis not present

## 2023-01-05 DIAGNOSIS — J310 Chronic rhinitis: Secondary | ICD-10-CM

## 2023-01-05 LAB — BASIC METABOLIC PANEL
BUN: 17 mg/dL (ref 6–23)
CO2: 28 mEq/L (ref 19–32)
Calcium: 9.5 mg/dL (ref 8.4–10.5)
Chloride: 99 mEq/L (ref 96–112)
Creatinine, Ser: 1.02 mg/dL (ref 0.40–1.50)
GFR: 81.45 mL/min (ref >60.00)
Glucose, Bld: 103 mg/dL — ABNORMAL HIGH (ref 70–99)
Potassium: 3.5 mEq/L (ref 3.5–5.1)
Sodium: 137 mEq/L (ref 135–145)

## 2023-01-05 MED ORDER — NAPROXEN 500 MG PO TABS: 500.0000 mg | ORAL_TABLET | Freq: Two times a day (BID) | ORAL | 0 refills | Status: DC

## 2023-01-05 NOTE — Assessment & Plan Note (Signed)
Continue metoprolol tartrate 25 mg bid. In light of some recent increased PVCs despite his beta blocker, I recommend we recheck his potassium level. I will also have him follow-up with cardiology.

## 2023-01-05 NOTE — Progress Notes (Signed)
[facility] [address] [place] [address] Dept: [phone] Dept Fax: [phone]  Chronic Care Office Visit  Subjective:    Patient ID: Dominic Davies, male    DOB: [birth year], 57 y.o..   MRN: [id number]  Chief Complaint  Patient presents with   Hypertension    4 month f/u.  C/o having LT foot/ankle swelling x 1-2 weeks.    History of Present Illness:  Patient is in today for reassessment of chronic medical issues.  Mr. Luebke has a history of hypertension. He is managed on Hyzaar 100-25 mg daily.    Mr. Govoni has a history of palpitations due to frequent PVCs. He is currently managed with metoprolol 25 mg bid. He notes that he has been again experiencing some dyspnea on exertion. After a recent workout, he recorded a rhythm strip, which demonstrated frequent PVCs.    Mr. Mccullen notes for the past 2 weeks, he has had swelling in the left foot. He thinks this may have occurred during a time when he was being more active. He has some mild tenderness in the lateral foot. He has a history of neuropathy, which continues to give him some symptoms. The swelling is a little improved in the morning, worsening during the day.  Mr. Doolin has had chronic persistent nasal symptoms, with congestion and rhinorrhea. This has now progressed to some chronic mild cough. He has used cetirizine and Flonase spray which did not seem to make a difference for him.  Past Medical History: Patient Active Problem List   Diagnosis Date Noted   Chronic rhinitis 01/05/2023   Osteoarthritis of carpometacarpal [facility]) joint of thumb 12/29/2021   Hypokalemia 11/17/2021   Polyneuropathy 06/27/2021   Hyperlipidemia 04/14/2021   Obstructive sleep apnea 04/13/2021   Frequent PVCs 04/13/2021   Essential hypertension 05/11/2018   History of atrial fibrillation 04/13/2018   Past Surgical History:  Procedure Laterality Date   APPENDECTOMY  05/10/1995    CARDIOVERSION     due to a fib   INGUINAL HERNIA REPAIR Left 09/07/2011   INGUINAL HERNIA REPAIR Right    Young child   Family History  Problem Relation Age of Onset   Cancer Mother        Breast   Heart disease Mother    Heart disease Father    Stroke Father    Parkinson's disease Father    Neuropathy Father    Colon polyps Sister    Hypertension Sister    Hypertension Sister    Hypertension Brother    Heart attack Other    Heart disease Other    Stroke Other    Hypertension Other    CAD Other    Colon cancer Neg Hx    Esophageal cancer Neg Hx    Rectal cancer Neg Hx    Stomach cancer Neg Hx    Outpatient Medications Prior to Visit  Medication Sig Dispense Refill   losartan-hydrochlorothiazide (HYZAAR) 100-25 MG tablet Take 1 tablet by mouth daily. 90 tablet 3   metoprolol tartrate (LOPRESSOR) 25 MG tablet Take 1 tablet by mouth twice daily 180 tablet 1   Multiple Vitamin (MULTIVITAMIN) tablet Take 1 tablet by mouth daily.     No facility-administered medications prior to visit.   No Known Allergies Objective:   Today's Vitals   01/05/23 0833  BP: 118/78  Pulse: 67  Temp: 98.3 F (36.8 C)  TempSrc: Temporal  SpO2: 97%  Weight: 187 lb (84.8 kg)  Height: 6' (1.829 m)   Body mass index is 25.36 kg/m.   General: Well developed, well nourished. No acute distress. Extremities: The left lower leg shows no edema. However, there is an area of localized edema over the 3rd-5th MTP   joints dorsally. There is some mild localized tenderness int his area as well. Motion of the foot and ankle are normal. Psych: Alert and oriented. Normal mood and affect.  There are no preventive care reminders to display for this patient.    Assessment & Plan:   Problem List Items Addressed This Visit       Cardiovascular and Mediastinum   Essential hypertension - Primary    Blood pressure is in good control. Continue Hyzaar 100-25 mg daily.       Frequent PVCs    Continue  metoprolol tartrate 25 mg bid. In light of some recent increased PVCs despite his beta blocker, I recommend we recheck his potassium level. I will also have him follow-up with cardiology.      Relevant Orders   Ambulatory referral to Cardiology   Basic metabolic panel     Respiratory   Chronic rhinitis    Mr. Dohmen has chronic rhinitis that had not responded to cetirizine and Flonase. I recommend we refer him to an allergist to help determine if this is an allergy issue vs. chronic non allergy rhinitis.      Relevant Orders   Ambulatory referral to Allergy     Other   Dyspnea on exertion    As noted above, I will have him see cardiology. He may benefit form a repeat echocardiogram.      Relevant Orders   Ambulatory referral to Cardiology   Hypokalemia    I recommend we repeat his potassium level in light of increased PVCs.      Relevant Orders   Basic metabolic panel   Other Visit Diagnoses     Localized swelling of left foot       Possible metyatarsalgia, though it is more lateral than typical. I recommend a 5-day course of an NSAID. If not improved, will refer to podiatry.   Relevant Medications   naproxen (NAPROSYN) 500 MG tablet       Return in about 3 months (around 04/07/2023) for Reassessment.   Loyola Mast, MD

## 2023-01-05 NOTE — Assessment & Plan Note (Signed)
Blood pressure is in good control.   -Continue Hyzaar 100-25 mg daily 

## 2023-01-05 NOTE — Assessment & Plan Note (Signed)
I recommend we repeat his potassium level in light of increased PVCs.

## 2023-01-05 NOTE — Assessment & Plan Note (Signed)
As noted above, I will have him see cardiology. He may benefit form a repeat echocardiogram.

## 2023-01-05 NOTE — Assessment & Plan Note (Addendum)
Dominic Davies has chronic rhinitis that had not responded to cetirizine and Flonase. I recommend we refer him to an allergist to help determine if this is an allergy issue vs. chronic non allergy rhinitis.

## 2023-01-12 ENCOUNTER — Ambulatory Visit: Payer: 59 | Admitting: Family Medicine

## 2023-01-20 ENCOUNTER — Other Ambulatory Visit: Payer: Self-pay | Admitting: Cardiovascular Disease

## 2023-01-28 NOTE — Progress Notes (Unsigned)
Cardiology Office Note:  .   Date:  01/30/2023  ID:  Dominic Davies, DOB 29-May-1965, MRN [id number] PCP: Loyola Mast, MD  [facility] Providers Cardiologist:  None    Patient Profile: .      PMH Hypertension PAF OSA on CPAP PVCs  Referred to cardiology and seen by Dr. Eden Emms 07/05/2018 for hypertension and palpitations.  He had PAF in 2015, quiescent for years. CHA2DS2-VASc score of 1.  Normal echocardiogram 2016. He limits caffeine and does not drink Etoh.  Works in [employer] related to [employer].  Cardiac monitor 07/2019 revealed normal sinus rhythm with frequent PVCs, no significant arrhythmia.  Minimal coronary calcium noted in RCA on CT 06/2019.   Last cardiology clinic visit was 01/18/2022 with Chelsea Aus, PA.  He reported noncompliance with his CPAP.  He was having ongoing palpitations.  Had been found to be hypokalemic recently.  Was encouraged to increase dietary intake of potassium. BP was well controlled. 1 year follow-up was recommended.       History of Present Illness: .   Dominic Davies is a very pleasant 57 y.o. male who is here today for annual follow-up. Reports he noted getting short of breath just walking across his yard last night.  Has also noticed DOE when running and felt chest discomfort noted as increased palpitations he thought were PVCs. He had associated diaphoresis with this episode. No other episodes of chest pain. Exercises consistently at least 3 times per week with 20-23 minutes of cardio followed by light weight lifting. Walks at a fast pace, will jog on occasion. Had started to jog when he had the DOE and palpitations noted above. Also bothered by a cough since July. Tries to hydrate well, drinks 3-4 bottles of water daily, drinks 1 full bottle first thing every morning.  Is careful about his diet but does admit to some dietary indiscretion. Previously ate a vegan diet and had very good results with his lab work at that time. He  denies orthopnea, PND, edema, presyncope, syncope.  ROS: See HPI       Studies Reviewed: Marland Kitchen   EKG Interpretation Date/Time:  Monday January 30 2023 10:56:13 EDT Ventricular Rate:  64 PR Interval:  164 QRS Duration:  92 QT Interval:  392 QTC Calculation: 404 R Axis:   55  Text Interpretation: Normal sinus rhythm Normal ECG When compared with ECG of 12-Jul-2020 01:01, Premature ventricular complexes are no longer Present No ST abnormality Confirmed by Eligha Bridegroom [phone]) on 01/30/2023 11:02:10 AM     Risk Assessment/Calculations:    CHA2DS2-VASc Score = 1   This indicates a 0.6% annual risk of stroke. The patient's score is based upon: CHF History: 0 HTN History: 1 Diabetes History: 0 Stroke History: 0 Vascular Disease History: 0 Age Score: 0 Gender Score: 0             Physical Exam:   VS:  BP 114/74   Pulse 70   Ht 6' (1.829 m)   Wt 191 lb 6.4 oz (86.8 kg)   SpO2 96%   BMI 25.96 kg/m    Wt Readings from Last 3 Encounters:  01/30/23 191 lb 6.4 oz (86.8 kg)  01/05/23 187 lb (84.8 kg)  09/20/22 191 lb 6.4 oz (86.8 kg)    GEN: Well nourished, well developed in no acute distress NECK: No JVD; No carotid bruits CARDIAC: RRR, no murmurs, rubs, gallops RESPIRATORY:  Clear to auscultation without rales, wheezing or  rhonchi  ABDOMEN: Soft, non-tender, non-distended EXTREMITIES:  No edema; No deformity     ASSESSMENT AND PLAN: .    DOE: He is having increased episodes of dyspnea on exertion over the past few months.  Had symptoms of palpitations and diaphoresis associated with DOE. Normal stress echo 2016, no recent ischemia evaluation. Additional risk factor of hyperlipidemia. No acute ST abnormality on EKG. We will get coronary CTA to evaluate for ischemia and for coronary mapping. He will take 50 mg of his Lopressor the night before and the morning of this procedure.   Hyperlipidemia: Direct LDL 142, triglycerides 233, non-HDL 179.54 on 06/17/22.  PCP advised  ASCVD risk score 10%, encouraged heart healthy diet along with 150 minutes of moderate intensity exercise each week and consider statin therapy. He would like to undergo coronary CT prior to starting statin.   PAF: Reported atrial fibrillation in 2015, no episodes since that time to his awareness. He is not on anticoagulation due to CHA2DS2-VASc score of 1. Continue to monitor for palpitations. Consider cardiac monitor if he develops worsening palpitations.   Hypertension: BP is well controlled.  Stable renal function on lab work completed 01/05/2023.  No medication changes today.  OSA on CPAP/Sinus pain: He is having sinus pressure and pain when wearing CPAP. Also having productive cough for several months. We will refer him to ENT for further evaluation. Emphasized the importance of treating OSA. Would recommend follow-up with Dr. Mayford Knife for sleep management after ENT consult.        Dispo: 2 months with me  Signed, Eligha Bridegroom, NP-C

## 2023-01-30 ENCOUNTER — Ambulatory Visit: Payer: 59 | Attending: Nurse Practitioner | Admitting: Nurse Practitioner

## 2023-01-30 ENCOUNTER — Encounter: Payer: Self-pay | Admitting: Nurse Practitioner

## 2023-01-30 VITALS — BP 114/74 | HR 70 | Ht 72.0 in | Wt 191.4 lb

## 2023-01-30 DIAGNOSIS — R0609 Other forms of dyspnea: Secondary | ICD-10-CM

## 2023-01-30 DIAGNOSIS — I10 Essential (primary) hypertension: Secondary | ICD-10-CM | POA: Diagnosis not present

## 2023-01-30 DIAGNOSIS — I480 Paroxysmal atrial fibrillation: Secondary | ICD-10-CM | POA: Diagnosis not present

## 2023-01-30 DIAGNOSIS — J349 Unspecified disorder of nose and nasal sinuses: Secondary | ICD-10-CM | POA: Diagnosis not present

## 2023-01-30 DIAGNOSIS — G4733 Obstructive sleep apnea (adult) (pediatric): Secondary | ICD-10-CM

## 2023-01-30 DIAGNOSIS — E785 Hyperlipidemia, unspecified: Secondary | ICD-10-CM

## 2023-01-30 NOTE — Patient Instructions (Signed)
Medication Instructions:   Your physician recommends that you continue on your current medications as directed. Please refer to the Current Medication list given to you today.   *If you need a refill on your cardiac medications before your next appointment, please call your pharmacy*   Lab Work:  TODAY!!!!! BMET  If you have labs (blood work) drawn today and your tests are completely normal, you will receive your results only by: MyChart Message (if you have MyChart) OR A paper copy in the mail If you have any lab test that is abnormal or we need to change your treatment, we will call you to review the results.   Testing/Procedures:    Your cardiac CT will be scheduled at one of the below locations:   [facility] [address] [place], [place] [address] [phone]   If scheduled at [facility], please arrive at the [facility] and Children's Entrance (Entrance C2) of [facility] 30 minutes prior to test start time. You can use the FREE valet parking offered at entrance C (encouraged to control the heart rate for the test)  Proceed to the [facility] Radiology Department (first floor) to check-in and test prep.  All radiology patients and guests should use entrance C2 at [facility], accessed from [facility], even though the hospital's physical address listed is [address]    There is spacious parking and easy access to the radiology department from the [facility] entrance. Please enter here and check-in with the desk attendant.   Please follow these instructions carefully (unless otherwise directed):  An IV will be required for this test and Nitroglycerin will be given.  Hold all erectile dysfunction medications at least 3 days (72 hrs) prior to test. (Ie viagra, cialis, sildenafil, tadalafil, etc)   On the Night Before the Test: Be sure to Drink plenty of water. Do not consume any  caffeinated/decaffeinated beverages or chocolate 12 hours prior to your test. Do not take any antihistamines 12 hours prior to your test.  On the Day of the Test: Drink plenty of water until 1 hour prior to the test. Do not eat any food 1 hour prior to test. You may take your regular medications prior to the test.  Take metoprolol (Lopressor) ( 50 MG) before bed and ( 50 mg) two hours prior to test. If you take Losartan- Hydrochlorothiazide please HOLD on the morning of the test.    After the Test: Drink plenty of water. After receiving IV contrast, you may experience a mild flushed feeling. This is normal. On occasion, you may experience a mild rash up to 24 hours after the test. This is not dangerous. If this occurs, you can take Benadryl 25 mg and increase your fluid intake. If you experience trouble breathing, this can be serious. If it is severe call 911 IMMEDIATELY. If it is mild, please call our office.   We will call to schedule your test 2-4 weeks out understanding that some insurance companies will need an authorization prior to the service being performed.   For more information and frequently asked questions, please visit our website : [email or web address]  For non-scheduling related questions, please contact the cardiac imaging nurse navigator should you have any questions/concerns: Cardiac Imaging Nurse Navigators Direct Office Dial: [phone]   For scheduling needs, including cancellations and rescheduling, please call [place], [phone].    Follow-Up: At [facility], you and your health needs are our  priority.  As part of our continuing mission to provide you with exceptional heart care, we have created designated Provider Care Teams.  These Care Teams include your primary Cardiologist (physician) and Advanced Practice Providers (APPs -  Physician Assistants and Nurse Practitioners) who all work together to provide you with the care you need,  when you need it.  We recommend signing up for the patient portal called "MyChart".  Sign up information is provided on this After Visit Summary.  MyChart is used to connect with patients for Virtual Visits (Telemedicine).  Patients are able to view lab/test results, encounter notes, upcoming appointments, etc.  Non-urgent messages can be sent to your provider as well.   To learn more about what you can do with MyChart, go to [email or web address].    Your next appointment:   2 month(s)  Provider:   Eligha Bridegroom, NP         Other Instructions  You have been referred to ENT for sinus problems. The office will call you to schedule appointment.

## 2023-01-31 LAB — BASIC METABOLIC PANEL
BUN/Creatinine Ratio: 12 (ref 9–20)
BUN: 13 mg/dL (ref 6–24)
CO2: 25 mmol/L (ref 20–29)
Calcium: 9.5 mg/dL (ref 8.7–10.2)
Chloride: 100 mmol/L (ref 96–106)
Creatinine, Ser: 1.06 mg/dL (ref 0.76–1.27)
Glucose: 97 mg/dL (ref 70–99)
Potassium: 4.2 mmol/L (ref 3.5–5.2)
Sodium: 140 mmol/L (ref 134–144)
eGFR: 82 mL/min/{1.73_m2} (ref >59)

## 2023-02-08 ENCOUNTER — Telehealth (HOSPITAL_COMMUNITY): Payer: Self-pay | Admitting: *Deleted

## 2023-02-08 NOTE — Telephone Encounter (Signed)
Reaching out to patient to offer assistance regarding upcoming cardiac imaging study; pt verbalizes understanding of appt date/time, parking situation and where to check in, pre-test NPO status and medications ordered, and verified current allergies; name and call back number provided for further questions should they arise Hayley Sharpe RN Navigator Cardiac Imaging [facility] [phone] office [phone] cell  

## 2023-02-09 ENCOUNTER — Ambulatory Visit (HOSPITAL_COMMUNITY)
Admission: RE | Admit: 2023-02-09 | Discharge: 2023-02-09 | Disposition: A | Discharge status: Home / Self Care | Payer: 59 | Source: Ambulatory Visit | Attending: Nurse Practitioner | Admitting: Nurse Practitioner

## 2023-02-09 DIAGNOSIS — I10 Essential (primary) hypertension: Secondary | ICD-10-CM | POA: Insufficient documentation

## 2023-02-09 DIAGNOSIS — J349 Unspecified disorder of nose and nasal sinuses: Secondary | ICD-10-CM | POA: Diagnosis not present

## 2023-02-09 DIAGNOSIS — R0609 Other forms of dyspnea: Secondary | ICD-10-CM | POA: Insufficient documentation

## 2023-02-09 DIAGNOSIS — I480 Paroxysmal atrial fibrillation: Secondary | ICD-10-CM | POA: Diagnosis not present

## 2023-02-09 MED ORDER — NITROGLYCERIN 0.4 MG SL SUBL
SUBLINGUAL_TABLET | SUBLINGUAL | Status: AC
  Filled 2023-02-09: qty 2

## 2023-02-09 MED ORDER — IOHEXOL 350 MG/ML SOLN
95.0000 mL | Freq: Once | INTRAVENOUS | Status: AC | PRN
  Administered 2023-02-09: 95 mL via INTRAVENOUS

## 2023-02-09 MED ORDER — NITROGLYCERIN 0.4 MG SL SUBL
0.8000 mg | SUBLINGUAL_TABLET | Freq: Once | SUBLINGUAL | Status: AC
  Administered 2023-02-09: 0.8 mg via SUBLINGUAL

## 2023-02-14 ENCOUNTER — Encounter: Payer: Self-pay | Admitting: Family Medicine

## 2023-02-14 DIAGNOSIS — J310 Chronic rhinitis: Secondary | ICD-10-CM

## 2023-02-14 MED ORDER — AZELASTINE HCL 0.1 % NA SOLN
1.0000 | Freq: Two times a day (BID) | NASAL | 12 refills | Status: AC
Start: 2023-02-14 — End: ?

## 2023-02-24 IMAGING — CT CT RENAL STONE PROTOCOL
2 of 4 series · 16 of 46 positions shown, 18 images · non-contrast
Comparison: None.

CLINICAL DATA: Left flank pain

EXAM:
CT ABDOMEN AND PELVIS WITHOUT CONTRAST
TECHNIQUE: Multidetector CT imaging of the abdomen and pelvis was performed
following the standard protocol without IV contrast.

[Series 3: stone study 5.0 i30f 2 · axial · 0.97mm/px · z∈[+796,+1276]mm · 13 of 106 slices shown, 15 images]
[im 5/106  soft-tissue]
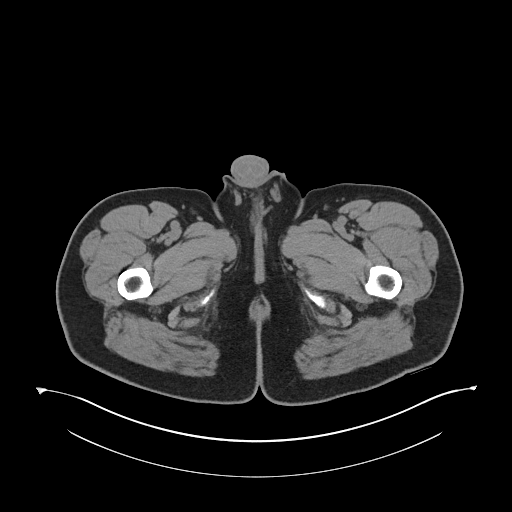
[im 5/106  bone]
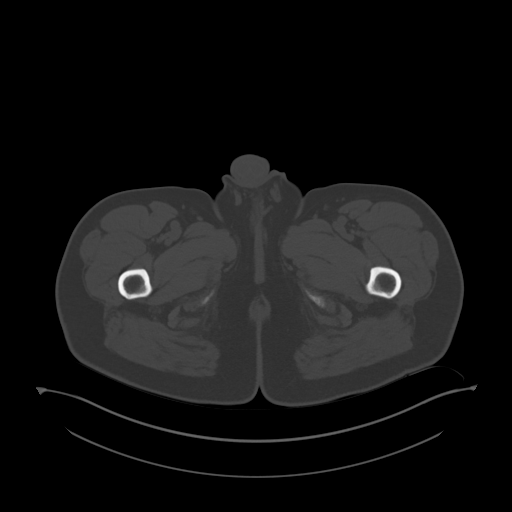
[im 14/106  soft-tissue]
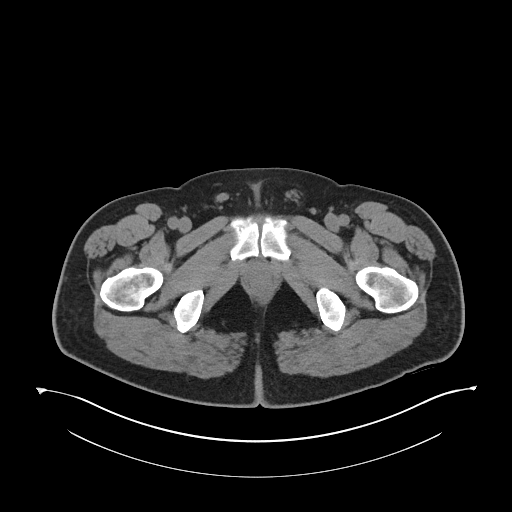
[im 22/106  soft-tissue]
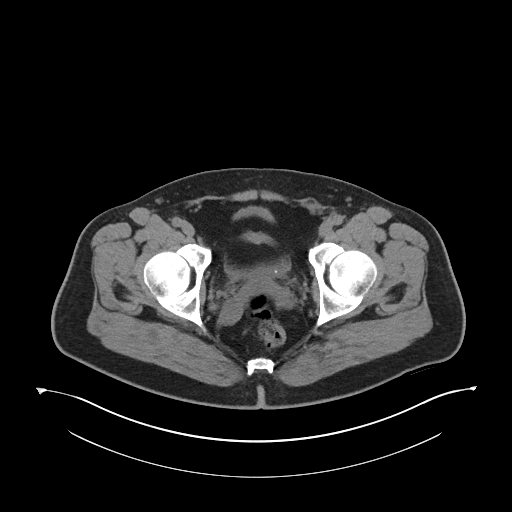
[im 31/106  soft-tissue]
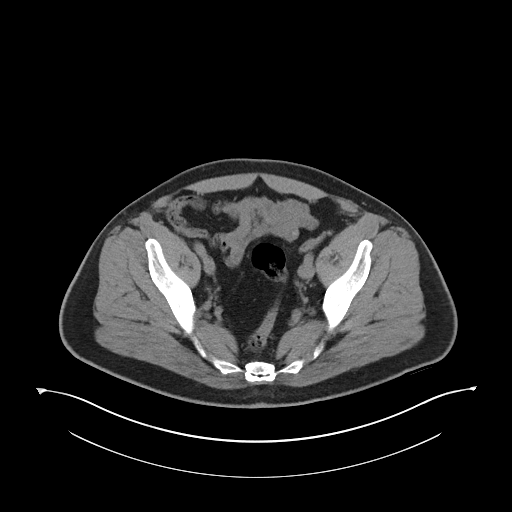
[im 36/106  soft-tissue]
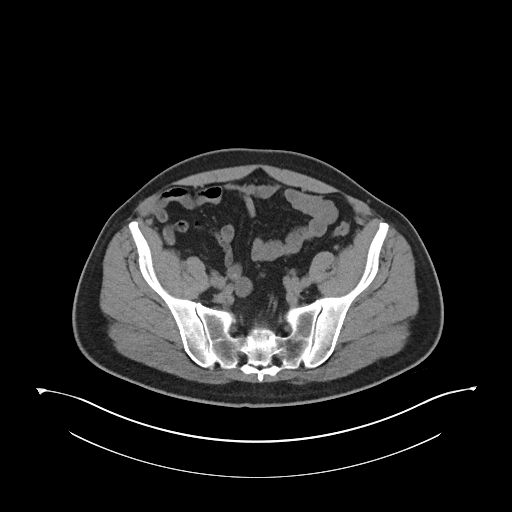
[im 44/106  soft-tissue]
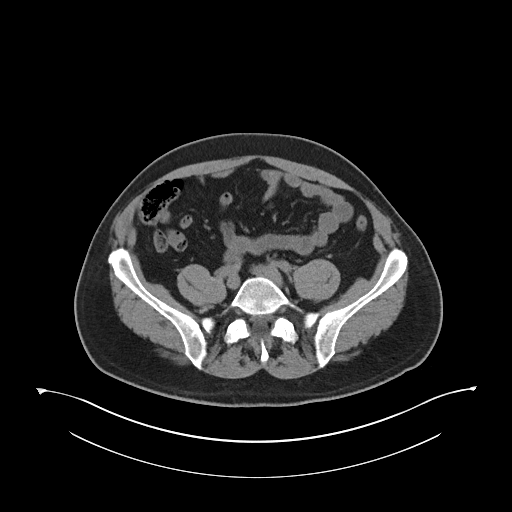
[im 53/106  soft-tissue]
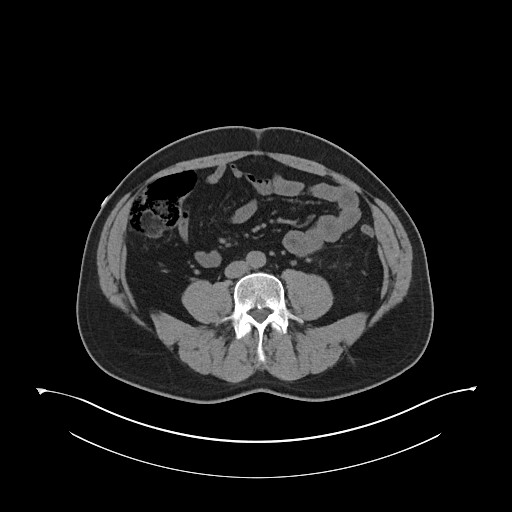
[im 62/106  soft-tissue]
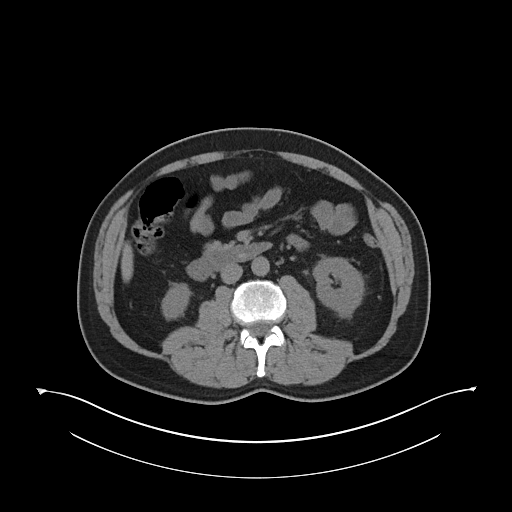
[im 71/106  soft-tissue]
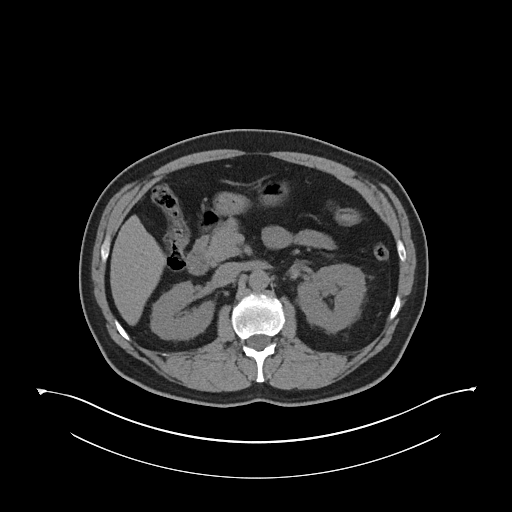
[im 71/106  bone]
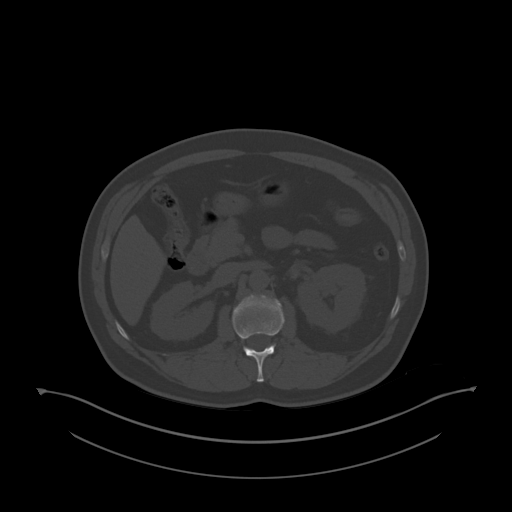
[im 75/106  soft-tissue]
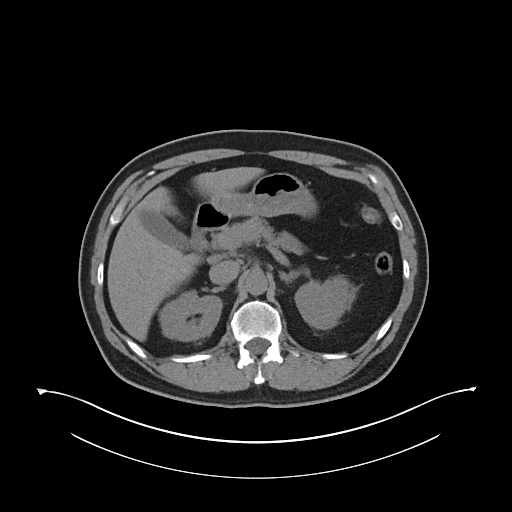
[im 84/106  soft-tissue]
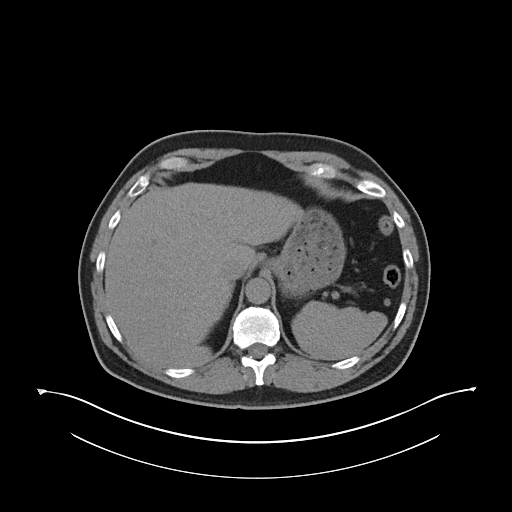
[im 92/106  soft-tissue]
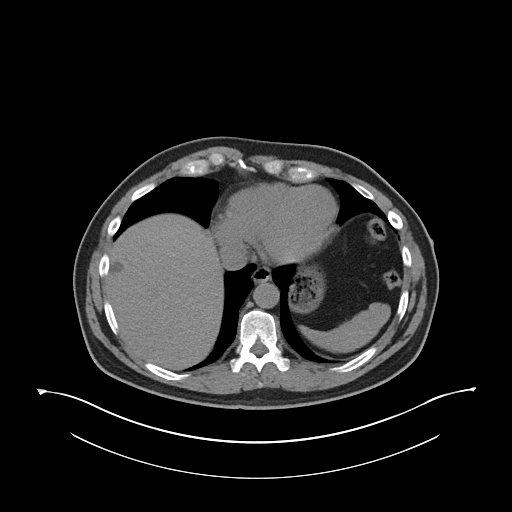
[im 101/106  soft-tissue]
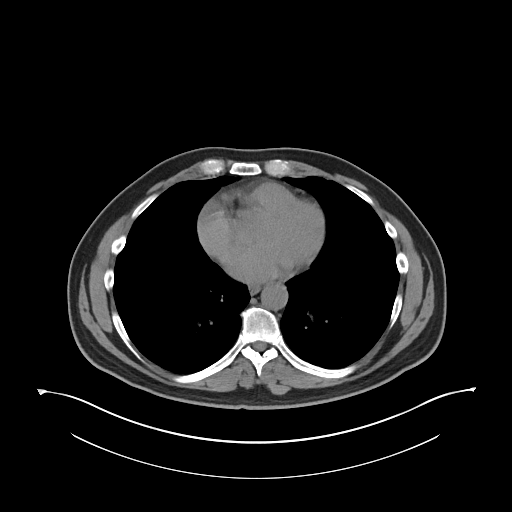

[Series 6: coronal soft tissue · coronal · 1.00mm/px · 3 of 101 slices shown]
[im 34/101  soft-tissue]
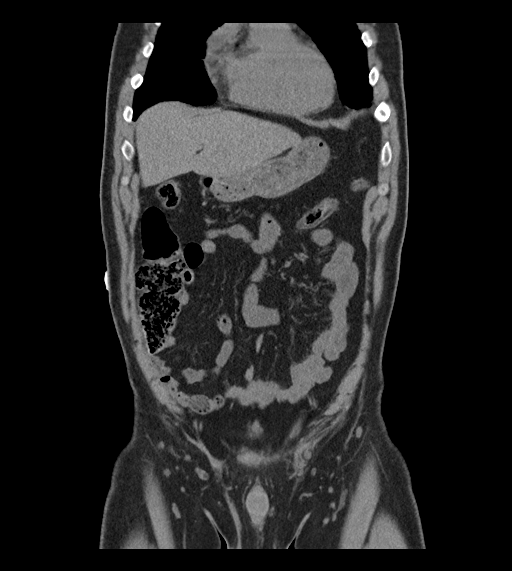
[im 45/101  soft-tissue]
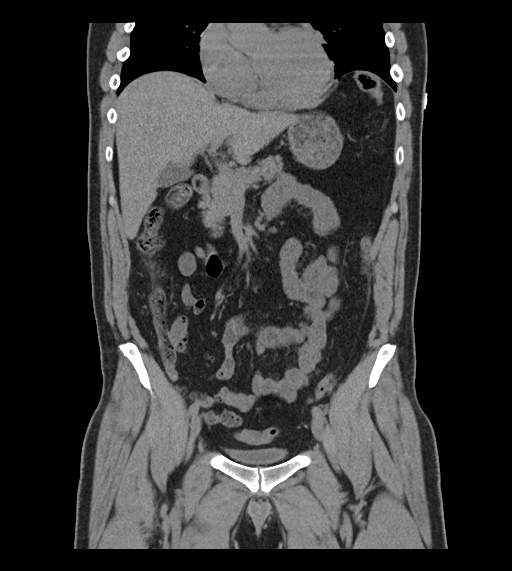
[im 56/101  soft-tissue]
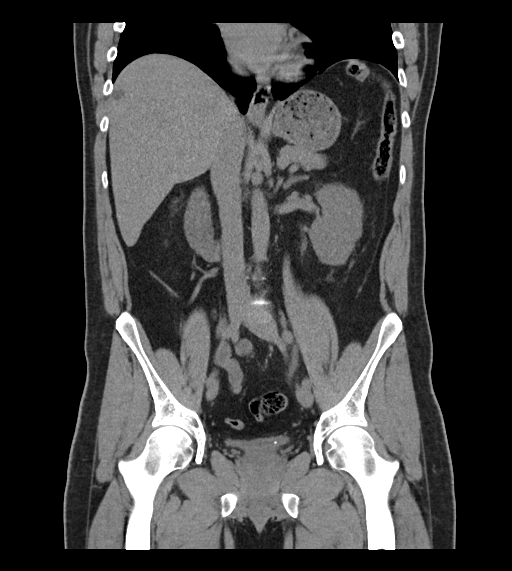

[16 of 46 positions shown; findings below may reference images not displayed]

FINDINGS: Lower chest: The visualized lung bases are clear bilaterally. The
visualized heart and pericardium are unremarkable.

Hepatobiliary: Scattered cysts within the liver. The liver is
otherwise unremarkable. Gallbladder unremarkable. No intra or
extrahepatic biliary ductal dilation.

Pancreas: Unremarkable

Spleen: Unremarkable

Adrenals/Urinary Tract: The adrenal glands are unremarkable. The
kidneys are normal in size and position. There is mild asymmetric
left perinephric stranding and minimal hydroureter secondary to a 3
mm calculus seen at the left ureterovesicular junction protruding
into the bladder lumen. Additional 2-3 mm punctate nonobstructing
calculus noted within the interpolar region of the right kidney. No
ureteral calculi on the right. No hydronephrosis. Bladder is
otherwise unremarkable.

Stomach/Bowel: The stomach, small bowel, and large bowel are
unremarkable. The appendix is absent. No free intraperitoneal gas or
fluid.

Vascular/Lymphatic: Mild atherosclerotic calcification within the
abdominal aorta. No aortic aneurysm. No pathologic adenopathy within
the abdomen and pelvis.

Reproductive: Prostate is unremarkable.

Other: Tiny fat containing umbilical hernia.  Rectum unremarkable.

Musculoskeletal: No acute bone abnormality.
IMPRESSION: Minimally obstructing 3 mm calculus at the left ureterovesicular
junction protruding into the bladder lumen resulting in mild left
hydroureter and asymmetric mild left perinephric stranding.
Superimposed minimal nonobstructing right nephrolithiasis.

## 2023-03-21 DIAGNOSIS — J324 Chronic pansinusitis: Secondary | ICD-10-CM | POA: Insufficient documentation

## 2023-03-23 ENCOUNTER — Ambulatory Visit: Payer: 59 | Admitting: Nurse Practitioner

## 2023-04-25 ENCOUNTER — Other Ambulatory Visit: Payer: Self-pay | Admitting: Cardiovascular Disease

## 2023-05-29 ENCOUNTER — Ambulatory Visit: Payer: Self-pay | Admitting: Nurse Practitioner

## 2023-06-21 ENCOUNTER — Encounter: Payer: Self-pay | Admitting: Family Medicine

## 2023-06-21 ENCOUNTER — Ambulatory Visit (INDEPENDENT_AMBULATORY_CARE_PROVIDER_SITE_OTHER): Payer: BC Managed Care – PPO | Admitting: Family Medicine

## 2023-06-21 VITALS — BP 124/72 | HR 61 | Temp 98.1°F | Ht 72.0 in | Wt 189.4 lb

## 2023-06-21 DIAGNOSIS — N200 Calculus of kidney: Secondary | ICD-10-CM

## 2023-06-21 DIAGNOSIS — R10A1 Flank pain, right side: Secondary | ICD-10-CM | POA: Insufficient documentation

## 2023-06-21 DIAGNOSIS — I10 Essential (primary) hypertension: Secondary | ICD-10-CM

## 2023-06-21 DIAGNOSIS — L609 Nail disorder, unspecified: Secondary | ICD-10-CM | POA: Diagnosis not present

## 2023-06-21 DIAGNOSIS — R109 Unspecified abdominal pain: Secondary | ICD-10-CM | POA: Diagnosis not present

## 2023-06-21 NOTE — Progress Notes (Signed)
[facility] [address] [place] [address] Dept: [phone] Dept Fax: [phone]  Chronic Care Office Visit  Subjective:    Patient ID: Dominic Davies, male    DOB: 1965/06/14, 58 y.o..   MRN: [id number]  Chief Complaint  Patient presents with   Hypertension    6 month f/u HTN.  C/o having pain in RT flank area x 1.5 weeks.  ? Kidney stone?  Also fingernail issues.    History of Present Illness:  Patient is in today for reassessment of chronic medical issues.  [name] has a history of hypertension. He is managed on Hyzaar 100-25 mg daily.    [name] notes that he has had right flank pain starting about 10 days ago. He has had a prior kidney stone and this felt similar. The pain has subsided some in the past few days. He never saw a stone pass.  [name] has noted some fingernail thickening and discoloration occurring for several months. He has noted had any recent major illness. He has no history of eczema or psoriasis.  Past Medical History: Patient Active Problem List   Diagnosis Date Noted   Nephrolithiasis 06/21/2023   Acute right flank pain 06/21/2023   Nail abnormalities 06/21/2023   Chronic pansinusitis 03/21/2023   Chronic rhinitis 01/05/2023   Dyspnea on exertion 01/05/2023   Osteoarthritis of carpometacarpal (CMC) joint of thumb 12/29/2021   Hypokalemia 11/17/2021   Polyneuropathy 06/27/2021   Hyperlipidemia 04/14/2021   Obstructive sleep apnea 04/13/2021   Frequent PVCs 04/13/2021   Essential hypertension 05/11/2018   History of atrial fibrillation 04/13/2018   Past Surgical History:  Procedure Laterality Date   APPENDECTOMY  05/10/1995   CARDIOVERSION     due to a fib   INGUINAL HERNIA REPAIR Left 09/07/2011   INGUINAL HERNIA REPAIR Right    Young child   Family History  Problem Relation Age of Onset   Cancer Mother        Breast   Heart disease Mother    Heart disease Father     Stroke Father    Parkinson's disease Father    Neuropathy Father    Colon polyps Sister    Hypertension Sister    Hypertension Sister    Hypertension Brother    Heart attack Other    Heart disease Other    Stroke Other    Hypertension Other    CAD Other    Colon cancer Neg Hx    Esophageal cancer Neg Hx    Rectal cancer Neg Hx    Stomach cancer Neg Hx    Outpatient Medications Prior to Visit  Medication Sig Dispense Refill   azelastine (ASTELIN) 0.1 % nasal spray Place 1 spray into both nostrils 2 (two) times daily. Use in each nostril as directed 30 mL 12   losartan-hydrochlorothiazide (HYZAAR) 100-25 MG tablet Take 1 tablet by mouth daily. 90 tablet 3   metoprolol tartrate (LOPRESSOR) 25 MG tablet Take 1 tablet by mouth twice daily 180 tablet 3   Multiple Vitamin (MULTIVITAMIN) tablet Take 1 tablet by mouth daily.     No facility-administered medications prior to visit.   No Known Allergies Objective:   Today's Vitals   06/21/23 1512  BP: 124/72  Pulse: 61  Temp: 98.1 F (36.7 C)  TempSrc: Temporal  SpO2: 98%  Weight: 189 lb 6.4 oz (85.9 kg)  Height: 6' (1.829 m)   Body mass index is 25.69 kg/m.  General: Well developed, well nourished. No acute distress. Skin: Warm and dry. The nails show a band of thickening starting a few mm from the proximal edge. There is some irregular brown   discoloration. Psych: Alert and oriented. Normal mood and affect.  Health Maintenance Due  Topic Date Due   Zoster Vaccines- Shingrix (1 of 2) Never done     Assessment & Plan:   Problem List Items Addressed This Visit       Cardiovascular and Mediastinum   Essential hypertension - Primary   Blood pressure is in good control. Continue Hyzaar 100-25 mg daily.       Relevant Orders   Basic metabolic panel     Musculoskeletal and Integument   Nail abnormalities   It is unclear what may be causing this. It does not have the classic appearance of onychomycosis. I will  check thyroid and vitamin levels. If this persists, may have him see dermatology.      Relevant Orders   Basic metabolic panel   TSH   VITAMIN D 25 Hydroxy (Vit-D Deficiency, Fractures)   Vitamin B1     Genitourinary   Nephrolithiasis     Other   Acute right flank pain   It is possible he is passing another kidney stone. I will check some labs to assess and order a CT renal stone study.      Relevant Orders   CT RENAL STONE STUDY    Return in about 3 months (around 09/18/2023) for Reassessment.   Loyola Mast, MD

## 2023-06-21 NOTE — Assessment & Plan Note (Signed)
Blood pressure is in good control.   -Continue Hyzaar 100-25 mg daily

## 2023-06-21 NOTE — Assessment & Plan Note (Signed)
It is unclear what may be causing this. It does not have the classic appearance of onychomycosis. I will check thyroid and vitamin levels. If this persists, may have him see dermatology.

## 2023-06-21 NOTE — Assessment & Plan Note (Signed)
It is possible he is passing another kidney stone. I will check some labs to assess and order a CT renal stone study.

## 2023-06-22 ENCOUNTER — Encounter: Payer: Self-pay | Admitting: Family Medicine

## 2023-06-22 ENCOUNTER — Telehealth: Payer: Self-pay

## 2023-06-22 LAB — TSH: TSH: 0.91 u[IU]/mL (ref 0.35–5.50)

## 2023-06-22 LAB — BASIC METABOLIC PANEL
BUN: 18 mg/dL (ref 6–23)
CO2: 29 meq/L (ref 19–32)
Calcium: 9.2 mg/dL (ref 8.4–10.5)
Chloride: 100 meq/L (ref 96–112)
Creatinine, Ser: 1.02 mg/dL (ref 0.40–1.50)
GFR: 81.19 mL/min (ref >60.00)
Glucose, Bld: 78 mg/dL (ref 70–99)
Potassium: 4 meq/L (ref 3.5–5.1)
Sodium: 139 meq/L (ref 135–145)

## 2023-06-22 LAB — VITAMIN D 25 HYDROXY (VIT D DEFICIENCY, FRACTURES): VITD: 32.04 ng/mL (ref 30.00–100.00)

## 2023-06-22 NOTE — Telephone Encounter (Signed)
Copied from CRM [id number]. Topic: General - Other >> Jun 22, 2023 12:00 PM Eunice Blase wrote: Reason for CRM: [facility] imaging per [facility] [phone] ext:[phone], fax [phone] needs Prior Authorization Pt is scheduled for Fri, 06/23/2023.

## 2023-06-22 NOTE — Telephone Encounter (Signed)
Unable to leave VM regarding lab results due to it is full.  Will try later. Dm/cma

## 2023-06-23 ENCOUNTER — Ambulatory Visit
Admission: RE | Admit: 2023-06-23 | Discharge: 2023-06-23 | Disposition: A | Discharge status: Home / Self Care | Payer: BC Managed Care – PPO | Source: Ambulatory Visit | Attending: Family Medicine | Admitting: Family Medicine

## 2023-06-23 DIAGNOSIS — R300 Dysuria: Secondary | ICD-10-CM | POA: Diagnosis not present

## 2023-06-23 DIAGNOSIS — N200 Calculus of kidney: Secondary | ICD-10-CM | POA: Diagnosis not present

## 2023-06-23 DIAGNOSIS — R109 Unspecified abdominal pain: Secondary | ICD-10-CM

## 2023-06-25 LAB — VITAMIN B1: Vitamin B1 (Thiamine): 32 nmol/L — ABNORMAL HIGH (ref 8–30)

## 2023-06-27 ENCOUNTER — Encounter: Payer: Self-pay | Admitting: Family Medicine

## 2023-09-19 ENCOUNTER — Encounter: Payer: Self-pay | Admitting: Family Medicine

## 2023-09-19 ENCOUNTER — Ambulatory Visit (INDEPENDENT_AMBULATORY_CARE_PROVIDER_SITE_OTHER): Payer: BC Managed Care – PPO | Admitting: Family Medicine

## 2023-09-19 VITALS — BP 118/66 | HR 73 | Temp 97.6°F | Ht 72.0 in | Wt 193.2 lb

## 2023-09-19 DIAGNOSIS — I10 Essential (primary) hypertension: Secondary | ICD-10-CM

## 2023-09-19 NOTE — Assessment & Plan Note (Signed)
 Blood pressure is in good control.   -Continue Hyzaar 100-25 mg daily

## 2023-09-19 NOTE — Progress Notes (Signed)
 [facility] [address] [place] [address] Dept: [phone] Dept Fax: [phone]  Chronic Care Office Visit  Subjective:    Patient ID: Dominic Davies, male    DOB: 1965-05-30, 58 y.o..   MRN: [id number]  Chief Complaint  Patient presents with   Hypertension    3 month f/u HTN.      History of Present Illness:  Patient is in today for reassessment of chronic medical issues.  Mr. Semrad has a history of hypertension. He is managed on Hyzaar 100-25 mg daily.  He notes he recently moved to a new home. He has fallen off of some of his regular exercise routine, but is glad to see his BP is doing well.  Past Medical History: Patient Active Problem List   Diagnosis Date Noted   Nephrolithiasis 06/21/2023   Acute right flank pain 06/21/2023   Nail abnormalities 06/21/2023   Chronic pansinusitis 03/21/2023   Chronic rhinitis 01/05/2023   Dyspnea on exertion 01/05/2023   Osteoarthritis of carpometacarpal (CMC) joint of thumb 12/29/2021   Hypokalemia 11/17/2021   Polyneuropathy 06/27/2021   Hyperlipidemia 04/14/2021   Obstructive sleep apnea 04/13/2021   Frequent PVCs 04/13/2021   Essential hypertension 05/11/2018   History of atrial fibrillation 04/13/2018   Past Surgical History:  Procedure Laterality Date   APPENDECTOMY  05/10/1995   CARDIOVERSION     due to a fib   INGUINAL HERNIA REPAIR Left 09/07/2011   INGUINAL HERNIA REPAIR Right    Young child   Family History  Problem Relation Age of Onset   Cancer Mother        Breast   Heart disease Mother    Heart disease Father    Stroke Father    Parkinson's disease Father    Neuropathy Father    Colon polyps Sister    Hypertension Sister    Hypertension Sister    Hypertension Brother    Heart attack Other    Heart disease Other    Stroke Other    Hypertension Other    CAD Other    Colon cancer Neg Hx    Esophageal cancer Neg Hx    Rectal cancer Neg  Hx    Stomach cancer Neg Hx    Outpatient Medications Prior to Visit  Medication Sig Dispense Refill   azelastine  (ASTELIN ) 0.1 % nasal spray Place 1 spray into both nostrils 2 (two) times daily. Use in each nostril as directed 30 mL 12   losartan -hydrochlorothiazide (HYZAAR) 100-25 MG tablet Take 1 tablet by mouth daily. 90 tablet 3   metoprolol  tartrate (LOPRESSOR ) 25 MG tablet Take 1 tablet by mouth twice daily 180 tablet 3   Multiple Vitamin (MULTIVITAMIN) tablet Take 1 tablet by mouth daily.     No facility-administered medications prior to visit.   No Known Allergies Objective:   Today's Vitals   09/19/23 1448  BP: 118/66  Pulse: 73  Temp: 97.6 F (36.4 C)  TempSrc: Temporal  SpO2: 96%  Weight: 193 lb 3.2 oz (87.6 kg)  Height: 6' (1.829 m)   Body mass index is 26.2 kg/m.   General: Well developed, well nourished. No acute distress. Psych: Alert and oriented. Normal mood and affect.  Health Maintenance Due  Topic Date Due   Zoster Vaccines- Shingrix (1 of 2) Never done      Assessment & Plan:   Problem List Items Addressed This Visit       Cardiovascular and Mediastinum  Essential hypertension - Primary    Return in about 4 months (around 01/20/2024) for Reassessment.   Graig Lawyer, MD

## 2023-11-03 ENCOUNTER — Other Ambulatory Visit: Payer: Self-pay | Admitting: Family Medicine

## 2023-11-03 DIAGNOSIS — I10 Essential (primary) hypertension: Secondary | ICD-10-CM

## 2024-01-27 ENCOUNTER — Other Ambulatory Visit: Payer: Self-pay | Admitting: Family Medicine

## 2024-01-27 DIAGNOSIS — I10 Essential (primary) hypertension: Secondary | ICD-10-CM

## 2024-01-29 ENCOUNTER — Encounter: Payer: Self-pay | Admitting: Family Medicine

## 2024-01-29 ENCOUNTER — Ambulatory Visit: Admitting: Family Medicine

## 2024-01-29 VITALS — BP 132/78 | HR 74 | Temp 97.4°F | Ht 72.0 in | Wt 200.0 lb

## 2024-01-29 DIAGNOSIS — I493 Ventricular premature depolarization: Secondary | ICD-10-CM | POA: Diagnosis not present

## 2024-01-29 DIAGNOSIS — I10 Essential (primary) hypertension: Secondary | ICD-10-CM

## 2024-01-29 NOTE — Progress Notes (Signed)
 [facility] [address] [place] [address] Dept: [phone] Dept Fax: [phone]  Chronic Care Office Visit  Subjective:    Patient ID: Dominic Davies, male    DOB: [birth year], 58 y.o..   MRN: [id number]  Chief Complaint  Patient presents with   Hypertension    4 month f/u HTN.  No concerns.  Declines flu shot.    History of Present Illness:  Patient is in today for reassessment of chronic medical issues.  Mr. Corcoran has a history of hypertension. He is managed on Hyzaar 100-25 mg daily.  He is still transitioning into his new home. He has fallen off of some of his regular exercise routine.  Mr. Searls has a history of frequent PVCs. He is managed on metoprolol  tartrate 25 mg bid.   Past Medical History: Patient Active Problem List   Diagnosis Date Noted   Nephrolithiasis 06/21/2023   Acute right flank pain 06/21/2023   Nail abnormalities 06/21/2023   Chronic pansinusitis 03/21/2023   Chronic rhinitis 01/05/2023   Dyspnea on exertion 01/05/2023   Osteoarthritis of carpometacarpal (CMC) joint of thumb 12/29/2021   Polyneuropathy 06/27/2021   Hyperlipidemia 04/14/2021   Obstructive sleep apnea 04/13/2021   Frequent PVCs 04/13/2021   Essential hypertension 05/11/2018   History of atrial fibrillation 04/13/2018   Past Surgical History:  Procedure Laterality Date   APPENDECTOMY  05/10/1995   CARDIOVERSION     due to a fib   INGUINAL HERNIA REPAIR Left 09/07/2011   INGUINAL HERNIA REPAIR Right    Young child   Family History  Problem Relation Age of Onset   Cancer Mother        Breast   Heart disease Mother    Heart disease Father    Stroke Father    Parkinson's disease Father    Neuropathy Father    Colon polyps Sister    Hypertension Sister    Hypertension Sister    Hypertension Brother    Heart attack Other    Heart disease Other    Stroke Other    Hypertension Other    CAD Other    Colon  cancer Neg Hx    Esophageal cancer Neg Hx    Rectal cancer Neg Hx    Stomach cancer Neg Hx    Outpatient Medications Prior to Visit  Medication Sig Dispense Refill   azelastine  (ASTELIN ) 0.1 % nasal spray Place 1 spray into both nostrils 2 (two) times daily. Use in each nostril as directed 30 mL 12   losartan -hydrochlorothiazide (HYZAAR) 100-25 MG tablet Take 1 tablet by mouth once daily 90 tablet 0   metoprolol  tartrate (LOPRESSOR ) 25 MG tablet Take 1 tablet by mouth twice daily 180 tablet 3   Multiple Vitamin (MULTIVITAMIN) tablet Take 1 tablet by mouth daily.     No facility-administered medications prior to visit.   No Known Allergies Objective:   Today's Vitals   01/29/24 0928  BP: 132/78  Pulse: 74  Temp: (!) 97.4 F (36.3 C)  TempSrc: Temporal  SpO2: 98%  Weight: 200 lb (90.7 kg)  Height: 6' (1.829 m)   Body mass index is 27.12 kg/m.   General: Well developed, well nourished. No acute distress. Psych: Alert and oriented. Normal mood and affect.  Health Maintenance Due  Topic Date Due   Hepatitis B Vaccines 19-59 Average Risk (1 of 3 - 19+ 3-dose series) Never done   Zoster Vaccines- Shingrix (1 of 2) Never done  Pneumococcal Vaccine: 50+ Years (1 of 1 - PCV) Never done     Assessment & Plan:   Problem List Items Addressed This Visit       Cardiovascular and Mediastinum   Essential hypertension - Primary   Blood pressure is in adequate control, though this is mildly higher than at his last visit. He has had 7-10 lbs of weight gain since then. We discussed him returning to more regular exercise. Continue losartan -HCTZ (Hyzaar) 100-25 mg daily.       Frequent PVCs   Continue metoprolol  tartrate 25 mg bid.       Return in about 4 months (around 05/30/2024) for Reassessment.   Garnette CHRISTELLA Simpler, MD

## 2024-01-29 NOTE — Assessment & Plan Note (Signed)
 Continue metoprolol tartrate 25 mg b.i.d.

## 2024-01-29 NOTE — Assessment & Plan Note (Signed)
 Blood pressure is in adequate control, though this is mildly higher than at his last visit. He has had 7-10 lbs of weight gain since then. We discussed him returning to more regular exercise. Continue losartan -HCTZ (Hyzaar) 100-25 mg daily.

## 2024-04-25 ENCOUNTER — Other Ambulatory Visit: Payer: Self-pay | Admitting: Family

## 2024-04-25 DIAGNOSIS — I10 Essential (primary) hypertension: Secondary | ICD-10-CM

## 2024-04-30 ENCOUNTER — Encounter: Payer: Self-pay | Admitting: Family Medicine

## 2024-04-30 ENCOUNTER — Telehealth: Admitting: Family Medicine

## 2024-04-30 VITALS — Temp 100.5°F

## 2024-04-30 DIAGNOSIS — R051 Acute cough: Secondary | ICD-10-CM

## 2024-04-30 DIAGNOSIS — J019 Acute sinusitis, unspecified: Secondary | ICD-10-CM | POA: Insufficient documentation

## 2024-04-30 DIAGNOSIS — R509 Fever, unspecified: Secondary | ICD-10-CM

## 2024-04-30 MED ORDER — AMOXICILLIN-POT CLAVULANATE 875-125 MG PO TABS: 1.0000 | ORAL_TABLET | Freq: Two times a day (BID) | ORAL | 0 refills | Status: DC

## 2024-04-30 NOTE — Progress Notes (Signed)
 "  Acute Office Visit  Subjective:     Patient ID: Dominic Davies, male    DOB: April 11, 1966, 58 y.o.   MRN: [id number]  Chief Complaint  Patient presents with   Cough    X 4 days. Taking Mucinex as needed.    Nasal Congestion  I connected with  ROXANNE ORNER on 04/30/2024 by a video enabled telemedicine application and verified that I am speaking with the correct person using two identifiers.   I discussed the limitations of evaluation and management by telemedicine. The patient expressed understanding and agreed to proceed. Virtual Visit via Video Note  I connected with DEBORAH DONDERO on 04/30/2024 at  1:30 PM EST by a video enabled telemedicine application and verified that I am speaking with the correct person using two identifiers.  Location: Patient: home  Provider: office    I discussed the limitations of evaluation and management by telemedicine and the availability of in person appointments. The patient expressed understanding and agreed to proceed.  History of Present Illness:    Observations/Objective:   Assessment and Plan:   Follow Up Instructions:    I discussed the assessment and treatment plan with the patient. The patient was provided an opportunity to ask questions and all were answered. The patient agreed with the plan and demonstrated an understanding of the instructions.   The patient was advised to call back or seek an in-person evaluation if the symptoms worsen or if the condition fails to improve as anticipated.     Darice JONELLE Brownie, FNP   HPI Patient is in today for nasal congestion and cough for 4 days.  Symptoms started with stuffy nose, developed into cough. Cough has gotten worse with some reported pain in middle of chest when coughing. Productive cough with yellow-brown color sputum. Fever today: 100.5 Some sinus pressure, not too bad. Virtual visit, unable to assess breath sounds. Will treat with Augmentin  875-125 mg BID for 10 days  due to fever,  productive cough, and pain with coughing.  If symptoms do not resolve, will need in person visit for physical exam Supportive therapy with Mucinex-DM, fluids, and rest. Tylenol  for fever, do not exceed 3000 mg in 24 hour period.  Declines need for nasal spray, has already.    ROS      Objective:    Temp (!) 100.5 F (38.1 C) (Oral)   SpO2 96%    Physical Exam Constitutional:      General: He is not in acute distress.    Appearance: He is ill-appearing.  Pulmonary:     Effort: Pulmonary effort is normal.     Comments: Unable to assess breath sounds today: virtual visit.  Skin:    General: Skin is warm and dry.  Neurological:     General: No focal deficit present.     Mental Status: He is alert. Mental status is at baseline.  Psychiatric:        Mood and Affect: Mood normal.        Behavior: Behavior normal.        Thought Content: Thought content normal.        Judgment: Judgment normal.     No results found for any visits on 04/30/24.      Assessment & Plan:   Problem List Items Addressed This Visit     Fever - Primary   Tylenol  for fever, do not exceed 3000 mg in 24 hour period.  Avoid NSAIDs due to high  blood pressure.        Relevant Medications   amoxicillin -clavulanate (AUGMENTIN ) 875-125 MG tablet   Acute cough   Will treat with Augmentin  875-125 mg BID for 10 days due to fever,  productive cough, and pain with coughing.  If symptoms do not resolve, will need in person visit for physical exam Supportive therapy with Mucinex-DM, fluids, and rest.       Relevant Medications   amoxicillin -clavulanate (AUGMENTIN ) 875-125 MG tablet    Meds ordered this encounter  Medications   amoxicillin -clavulanate (AUGMENTIN ) 875-125 MG tablet    Sig: Take 1 tablet by mouth 2 (two) times daily.    Dispense:  20 tablet    Refill:  0    Supervising Provider:   METHENEY, CATHERINE D [[id number]]  Agrees with plan of care discussed.  Questions  answered.   Return if symptoms worsen or fail to improve.  Darice JONELLE Brownie, FNP   "

## 2024-04-30 NOTE — Assessment & Plan Note (Signed)
 Will treat with Augmentin  875-125 mg BID for 10 days due to fever,  productive cough, and pain with coughing.  If symptoms do not resolve, will need in person visit for physical exam Supportive therapy with Mucinex-DM, fluids, and rest.

## 2024-04-30 NOTE — Assessment & Plan Note (Signed)
 Tylenol  for fever, do not exceed 3000 mg in 24 hour period.  Avoid NSAIDs due to high blood pressure.

## 2024-05-01 ENCOUNTER — Ambulatory Visit: Admitting: Family Medicine

## 2024-05-03 ENCOUNTER — Encounter: Payer: Self-pay | Admitting: Family Medicine

## 2024-05-05 ENCOUNTER — Other Ambulatory Visit: Payer: Self-pay | Admitting: Nurse Practitioner

## 2024-05-06 ENCOUNTER — Encounter: Payer: Self-pay | Admitting: Emergency Medicine

## 2024-05-06 ENCOUNTER — Ambulatory Visit: Admitting: Emergency Medicine

## 2024-05-06 VITALS — BP 110/62 | HR 70 | Resp 16 | Ht 72.0 in | Wt 197.0 lb

## 2024-05-06 DIAGNOSIS — J209 Acute bronchitis, unspecified: Secondary | ICD-10-CM | POA: Diagnosis not present

## 2024-05-06 MED ORDER — BENZONATATE 200 MG PO CAPS: 200.0000 mg | ORAL_CAPSULE | Freq: Three times a day (TID) | ORAL | 0 refills | Status: DC | PRN

## 2024-05-06 MED ORDER — PREDNISONE 20 MG PO TABS: 40.0000 mg | ORAL_TABLET | Freq: Every day | ORAL | 0 refills | Status: AC

## 2024-05-06 MED ORDER — ALBUTEROL SULFATE HFA 108 (90 BASE) MCG/ACT IN AERS: 2.0000 | INHALATION_SPRAY | Freq: Four times a day (QID) | RESPIRATORY_TRACT | 2 refills | Status: AC | PRN

## 2024-05-06 NOTE — Progress Notes (Signed)
 "   Assessment & Plan:   Assessment & Plan Acute bronchitis, unspecified organism Lungs clear no hypoxia, fever resolved Has had bronchitis in the past We discussed continued supportive measures vs rx as below  Recheck if new or worsening symptoms Orders:   benzonatate  (TESSALON ) 200 MG capsule; Take 1 capsule (200 mg total) by mouth 3 (three) times daily as needed for cough.   predniSONE (DELTASONE) 20 MG tablet; Take 2 tablets (40 mg total) by mouth daily for 5 days.   albuterol  (VENTOLIN  HFA) 108 (90 Base) MCG/ACT inhaler; Inhale 2 puffs into the lungs every 6 (six) hours as needed for wheezing or shortness of breath.     Corean Geralds, MSPAS, PA-C    Subjective:   Chief Complaint  Patient presents with   Cough    Worsening cough and sinus drainage x 6 days. Drainage is improving.     HPI: Dominic Davies is a 58 y.o. male presenting on 05/06/2024 with report of cough.  He had a telemedicine visit 6 days ago, symptoms for 4 days at that time to include cough, nasal congestion, central chest pain with coughing, yellow-brown sputum, and low grade fever. He was rx Augmentin  BID x 10 days with recommendation for in-person evaluation if he did not improve.  3 days later sent a message to provider that he was not improving with continued fever to 101, painful cough, and fatigue.  Since that time, head congestion and fever are improved while cough continues with fits that are hard to control- feels a little worse that it had been. This am brought up a bit of mucous though yesterday was more dry. Felt a little wheezing last night that improved with continued coughing. Central chest pain with cough now more bilateral burning. Also some right sided pain feels like muscle strain, no pleuritic pain. No hx asthma, COPD. Non-smoker.     ROS: Negative unless specifically indicated above in HPI.   Relevant past medical history reviewed and updated as indicated.   Allergies and  medications reviewed and updated.  Current Medications[1]  Allergies[2]  Social History[3]   Objective:   Vitals:   05/06/24 1423  BP: 110/62  Pulse: 70  Resp: 16  Height: 6' (1.829 m)  Weight: 197 lb (89.4 kg)  SpO2: 97%  BMI (Calculated): 26.71     Appears well, INAD Sclera anicteric Ears clear Nares patent Oral mucosa moist. OP cobblestone Neck supple no LAD Heart RRR Normal resp effort and excursion. CTAB      [1]  Current Outpatient Medications:    albuterol  (VENTOLIN  HFA) 108 (90 Base) MCG/ACT inhaler, Inhale 2 puffs into the lungs every 6 (six) hours as needed for wheezing or shortness of breath., Disp: 8 g, Rfl: 2   amoxicillin -clavulanate (AUGMENTIN ) 875-125 MG tablet, Take 1 tablet by mouth 2 (two) times daily., Disp: 20 tablet, Rfl: 0   azelastine  (ASTELIN ) 0.1 % nasal spray, Place 1 spray into both nostrils 2 (two) times daily. Use in each nostril as directed, Disp: 30 mL, Rfl: 12   benzonatate  (TESSALON ) 200 MG capsule, Take 1 capsule (200 mg total) by mouth 3 (three) times daily as needed for cough., Disp: 30 capsule, Rfl: 0   losartan -hydrochlorothiazide (HYZAAR) 100-25 MG tablet, Take 1 tablet by mouth once daily, Disp: 90 tablet, Rfl: 3   metoprolol  tartrate (LOPRESSOR ) 25 MG tablet, Take 1 tablet by mouth twice daily, Disp: 180 tablet, Rfl: 3   Multiple Vitamin (MULTIVITAMIN) tablet, Take 1 tablet by  mouth daily., Disp: , Rfl:    predniSONE (DELTASONE) 20 MG tablet, Take 2 tablets (40 mg total) by mouth daily for 5 days., Disp: 10 tablet, Rfl: 0 [2] No Known Allergies [3]  Social History Tobacco Use   Smoking status: Never   Smokeless tobacco: Never  Vaping Use   Vaping status: Never Used  Substance Use Topics   Alcohol use: No    Alcohol/week: 0.0 standard drinks of alcohol   Drug use: No   "

## 2024-05-13 ENCOUNTER — Encounter: Payer: Self-pay | Admitting: Family Medicine

## 2024-05-13 MED ORDER — METOPROLOL TARTRATE 25 MG PO TABS: 25.0000 mg | ORAL_TABLET | Freq: Two times a day (BID) | ORAL | 3 refills | Status: AC

## 2024-05-13 NOTE — Telephone Encounter (Signed)
 Refill request for  Metoprolol  25 mg LR  04/25/23  HX provider LOV 01/29/23 FOV  05/30/24  Please review and advise.  Thanks. Dm/cma

## 2024-05-30 ENCOUNTER — Encounter: Payer: Self-pay | Admitting: Family Medicine

## 2024-05-30 ENCOUNTER — Ambulatory Visit: Admitting: Family Medicine

## 2024-05-30 VITALS — BP 118/74 | HR 73 | Temp 97.5°F | Ht 72.0 in | Wt 195.4 lb

## 2024-05-30 DIAGNOSIS — E782 Mixed hyperlipidemia: Secondary | ICD-10-CM | POA: Diagnosis not present

## 2024-05-30 DIAGNOSIS — R058 Other specified cough: Secondary | ICD-10-CM | POA: Diagnosis not present

## 2024-05-30 DIAGNOSIS — I10 Essential (primary) hypertension: Secondary | ICD-10-CM

## 2024-05-30 DIAGNOSIS — I493 Ventricular premature depolarization: Secondary | ICD-10-CM

## 2024-05-30 LAB — BASIC METABOLIC PANEL WITH GFR
BUN: 15 mg/dL (ref 6–23)
CO2: 28 meq/L (ref 19–32)
Calcium: 9.6 mg/dL (ref 8.4–10.5)
Chloride: 101 meq/L (ref 96–112)
Creatinine, Ser: 1.08 mg/dL (ref 0.40–1.50)
GFR: 75.31 mL/min
Glucose, Bld: 99 mg/dL (ref 70–99)
Potassium: 3.5 meq/L (ref 3.5–5.1)
Sodium: 137 meq/L (ref 135–145)

## 2024-05-30 LAB — LIPID PANEL
Cholesterol: 194 mg/dL (ref 28–200)
HDL: 32.6 mg/dL — ABNORMAL LOW
LDL Cholesterol: 97 mg/dL (ref 10–99)
NonHDL: 161.68
Total CHOL/HDL Ratio: 6
Triglycerides: 323 mg/dL — ABNORMAL HIGH (ref 10.0–149.0)
VLDL: 64.6 mg/dL — ABNORMAL HIGH (ref 0.0–40.0)

## 2024-05-30 NOTE — Assessment & Plan Note (Signed)
 Prior CAC scan showed mild total plaque volume. Cardiology had recommended a statin, but Dominic Davies is not currently taking one. I will recheck his lipids today.

## 2024-05-30 NOTE — Assessment & Plan Note (Addendum)
 Stable. Continue metoprolol tartrate 25 mg bid.

## 2024-05-30 NOTE — Progress Notes (Signed)
 " [facility] [address] [place] [address] Dept: [phone] Dept Fax: [phone]  Chronic Care Office Visit  Subjective:    Patient ID: Dominic Davies, male    DOB: [birth year], 59 y.o..   MRN: [id number]  Chief Complaint  Patient presents with   Hypertension    4 month f/u HTN.  C/o having lingering cough from the flu 1 month ago. Fasting today.    History of Present Illness:  Patient is in today for reassessment of chronic medical conditions.   Dominic Davies has a history of hypertension. He is managed on losartan -HCTZ (Hyzaar) 100-25 mg daily.  He has completed his move. This did disrupt his previous exercise routine. He is starting to work back into this.   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 He is managed on metoprolol  tartrate 25 mg bid.   Dominic Davies notes he had influenza about a month ago. He has a persistent cough with mucous production.  Past Medical History: Patient Active Problem List   Diagnosis Date Noted   Acute non-recurrent sinusitis 04/30/2024   Fever 04/30/2024   Acute cough 04/30/2024   Nephrolithiasis 06/21/2023   Acute right flank pain 06/21/2023   Nail abnormalities 06/21/2023   Chronic pansinusitis 03/21/2023   Chronic rhinitis 01/05/2023   Dyspnea on exertion 01/05/2023   Osteoarthritis of carpometacarpal (CMC) joint of thumb 12/29/2021   Polyneuropathy 06/27/2021   Hyperlipidemia 04/14/2021   Obstructive sleep apnea 04/13/2021   Frequent PVCs 04/13/2021   Essential hypertension 05/11/2018   History of atrial fibrillation 04/13/2018   Past Surgical History:  Procedure Laterality Date   APPENDECTOMY  05/10/1995   CARDIOVERSION     due to a fib   INGUINAL HERNIA REPAIR Left 09/07/2011   INGUINAL HERNIA REPAIR Right    Young child   Family History  Problem Relation Age of Onset   Cancer Mother        Breast   Heart disease Mother    Heart disease Father    Stroke Father     Parkinson's disease Father    Neuropathy Father    Colon polyps Sister    Hypertension Sister    Hypertension Sister    Hypertension Brother    Heart attack Other    Heart disease Other    Stroke Other    Hypertension Other    CAD Other    Colon cancer Neg Hx    Esophageal cancer Neg Hx    Rectal cancer Neg Hx    Stomach cancer Neg Hx    Outpatient Medications Prior to Visit  Medication Sig Dispense Refill   albuterol  (VENTOLIN  HFA) 108 (90 Base) MCG/ACT inhaler Inhale 2 puffs into the lungs every 6 (six) hours as needed for wheezing or shortness of breath. 8 g 2   amoxicillin -clavulanate (AUGMENTIN ) 875-125 MG tablet Take 1 tablet by mouth 2 (two) times daily. 20 tablet 0   azelastine  (ASTELIN ) 0.1 % nasal spray Place 1 spray into both nostrils 2 (two) times daily. Use in each nostril as directed 30 mL 12   benzonatate  (TESSALON ) 200 MG capsule Take 1 capsule (200 mg total) by mouth 3 (three) times daily as needed for cough. 30 capsule 0   losartan -hydrochlorothiazide (HYZAAR) 100-25 MG tablet Take 1 tablet by mouth once daily 90 tablet 3   metoprolol  tartrate (LOPRESSOR ) 25 MG tablet Take 1 tablet (25 mg total) by mouth 2 (two) times daily. 180 tablet 3  Multiple Vitamin (MULTIVITAMIN) tablet Take 1 tablet by mouth daily.     No facility-administered medications prior to visit.   Allergies[1]   Objective:   Today's Vitals   05/30/24 0909  BP: 118/74  Pulse: 73  Temp: (!) 97.5 F (36.4 C)  TempSrc: Temporal  SpO2: 96%  Weight: 195 lb 6.4 oz (88.6 kg)  Height: 6' (1.829 m)   Body mass index is 26.5 kg/m.   General: Well developed, well nourished. No acute distress. Lungs: Clear to auscultation bilaterally. No wheezing, rales, or rhonchi. Psych: Alert and oriented. Normal mood and affect.  Health Maintenance Due  Topic Date Due   COVID-19 Vaccine (1) Never done     Assessment & Plan:   Problem List Items Addressed This Visit       Cardiovascular and  Mediastinum   Essential hypertension - Primary   Blood pressure is in good control. Continue losartan -HCTZ (Hyzaar) 100-25 mg daily.       Relevant Orders   Basic metabolic panel with GFR   Frequent PVCs   Stable. Continue metoprolol  tartrate 25 mg bid.        Other   Hyperlipidemia   Prior CAC scan showed mild total plaque volume. Cardiology had recommended a statin, but Dominic Davies is not currently taking one. I will recheck his lipids today.      Relevant Orders   Lipid panel   Other Visit Diagnoses       Post-viral cough syndrome       Appears to be lingering post-bronchitic cough. Recommend hot tea with honey and use of Mucinex if needed.       Return in about 4 months (around 09/27/2024) for Reassessment.   Garnette CHRISTELLA Simpler, MD  I,Emily Lagle,acting as a scribe for Garnette CHRISTELLA Simpler, MD.,have documented all relevant documentation on the behalf of Garnette CHRISTELLA Simpler, MD.  I, Garnette CHRISTELLA Simpler, MD, have reviewed all documentation for this visit. The documentation on 05/30/2024 for the exam, diagnosis, procedures, and orders are all accurate and complete.      [1] No Known Allergies  "

## 2024-05-30 NOTE — Assessment & Plan Note (Addendum)
Blood pressure is in good control. Continue losartan-HCTZ (Hyzaar) 100-25 mg daily.

## 2024-05-31 ENCOUNTER — Ambulatory Visit: Payer: Self-pay | Admitting: Family Medicine

## 2024-09-27 ENCOUNTER — Ambulatory Visit: Admitting: Family Medicine
